# Patient Record
Sex: Male | Born: 1955 | Race: White | Hispanic: No | Marital: Married | Smoking: Former smoker
Health system: Southern US, Community
[De-identification: ages and names within clinical notes are randomized; demographics above are authoritative.]

## PROBLEM LIST (undated history)

## (undated) DIAGNOSIS — C3490 Malignant neoplasm of unspecified part of unspecified bronchus or lung: Secondary | ICD-10-CM

## (undated) DIAGNOSIS — I10 Essential (primary) hypertension: Secondary | ICD-10-CM

## (undated) DIAGNOSIS — E785 Hyperlipidemia, unspecified: Secondary | ICD-10-CM

## (undated) DIAGNOSIS — T63301A Toxic effect of unspecified spider venom, accidental (unintentional), initial encounter: Secondary | ICD-10-CM

## (undated) DIAGNOSIS — J189 Pneumonia, unspecified organism: Secondary | ICD-10-CM

## (undated) DIAGNOSIS — Z923 Personal history of irradiation: Secondary | ICD-10-CM

## (undated) DIAGNOSIS — C140 Malignant neoplasm of pharynx, unspecified: Secondary | ICD-10-CM

## (undated) HISTORY — DX: Malignant neoplasm of pharynx, unspecified: C14.0

## (undated) HISTORY — PX: COLON SURGERY: SHX602

## (undated) HISTORY — PX: APPENDECTOMY: SHX54

## (undated) HISTORY — PX: TOTAL HIP ARTHROPLASTY: SHX124

## (undated) HISTORY — DX: Personal history of irradiation: Z92.3

## (undated) HISTORY — DX: Hyperlipidemia, unspecified: E78.5

## (undated) HISTORY — DX: Toxic effect of unspecified spider venom, accidental (unintentional), initial encounter: T63.301A

## (undated) HISTORY — DX: Essential (primary) hypertension: I10

## (undated) HISTORY — PX: JOINT REPLACEMENT: SHX530

---
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
## 1991-02-20 DIAGNOSIS — T63301A Toxic effect of unspecified spider venom, accidental (unintentional), initial encounter: Secondary | ICD-10-CM

## 1991-02-20 HISTORY — DX: Toxic effect of unspecified spider venom, accidental (unintentional), initial encounter: T63.301A

## 1997-09-13 ENCOUNTER — Other Ambulatory Visit: Admission: RE | Admit: 1997-09-13 | Discharge: 1997-09-13 | Payer: Self-pay | Admitting: *Deleted

## 1997-09-30 ENCOUNTER — Inpatient Hospital Stay (HOSPITAL_COMMUNITY): Admission: RE | Admit: 1997-09-30 | Discharge: 1997-10-03 | Payer: Self-pay | Admitting: *Deleted

## 1997-11-24 ENCOUNTER — Inpatient Hospital Stay (HOSPITAL_COMMUNITY): Admission: RE | Admit: 1997-11-24 | Discharge: 1997-11-27 | Payer: Self-pay | Admitting: *Deleted

## 1997-11-24 ENCOUNTER — Encounter: Payer: Self-pay | Admitting: *Deleted

## 1997-12-24 ENCOUNTER — Encounter: Payer: Self-pay | Admitting: Emergency Medicine

## 1997-12-24 ENCOUNTER — Inpatient Hospital Stay (HOSPITAL_COMMUNITY): Admission: EM | Admit: 1997-12-24 | Discharge: 1997-12-24 | Payer: Self-pay | Admitting: Emergency Medicine

## 1999-12-08 ENCOUNTER — Encounter: Payer: Self-pay | Admitting: *Deleted

## 1999-12-13 ENCOUNTER — Inpatient Hospital Stay (HOSPITAL_COMMUNITY): Admission: RE | Admit: 1999-12-13 | Discharge: 1999-12-15 | Payer: Self-pay | Admitting: *Deleted

## 2006-02-19 HISTORY — PX: THROAT SURGERY: SHX803

## 2006-07-26 ENCOUNTER — Encounter (INDEPENDENT_AMBULATORY_CARE_PROVIDER_SITE_OTHER): Payer: Self-pay | Admitting: Otolaryngology

## 2006-07-26 ENCOUNTER — Ambulatory Visit (HOSPITAL_BASED_OUTPATIENT_CLINIC_OR_DEPARTMENT_OTHER): Admission: RE | Admit: 2006-07-26 | Discharge: 2006-07-26 | Payer: Self-pay | Admitting: Otolaryngology

## 2006-08-05 ENCOUNTER — Ambulatory Visit: Admission: RE | Admit: 2006-08-05 | Discharge: 2006-10-10 | Payer: Self-pay | Admitting: Radiation Oncology

## 2006-08-09 ENCOUNTER — Ambulatory Visit (HOSPITAL_COMMUNITY): Admission: RE | Admit: 2006-08-09 | Discharge: 2006-08-09 | Payer: Self-pay | Admitting: Radiation Oncology

## 2006-11-04 ENCOUNTER — Ambulatory Visit (HOSPITAL_COMMUNITY): Admission: RE | Admit: 2006-11-04 | Discharge: 2006-11-04 | Payer: Self-pay | Admitting: Radiation Oncology

## 2006-11-04 ENCOUNTER — Encounter: Payer: Self-pay | Admitting: Radiation Oncology

## 2007-07-31 ENCOUNTER — Ambulatory Visit (HOSPITAL_COMMUNITY): Admission: RE | Admit: 2007-07-31 | Discharge: 2007-07-31 | Payer: Self-pay | Admitting: Radiation Oncology

## 2008-02-09 ENCOUNTER — Ambulatory Visit (HOSPITAL_COMMUNITY): Admission: RE | Admit: 2008-02-09 | Discharge: 2008-02-09 | Payer: Self-pay | Admitting: Radiation Oncology

## 2008-08-19 ENCOUNTER — Ambulatory Visit (HOSPITAL_COMMUNITY): Admission: RE | Admit: 2008-08-19 | Discharge: 2008-08-19 | Payer: Self-pay | Admitting: Radiation Oncology

## 2010-07-04 NOTE — Op Note (Signed)
NAMEJUMAR, GREENSTREET NO.:  [id number]   MEDICAL RECORD NO.:  [id number]          PATIENT TYPE:  AMB   LOCATION:  DSC                          FACILITY:  [facility]   PHYSICIAN:  Kristine Garbe. Ezzard Standing, M.D.DATE OF BIRTH:  [birth year]   DATE OF PROCEDURE:  07/26/2006  DATE OF DISCHARGE:                               OPERATIVE REPORT   PREOP DIAGNOSIS:  Hoarseness with left anterior vocal cord lesion.   POSTOP DIAGNOSIS:  Hoarseness with left anterior vocal cord lesion.   OPERATION:  Microlaryngoscopy with biopsy of the left anterior vocal  cord lesion.   SURGEON:  Kristine Garbe. Ezzard Standing, M.D.   ANESTHESIA:  General endotracheal.   COMPLICATIONS:  None.   BRIEF CLINICAL NOTE:  Robert Powers is a 55 year old gentleman who has had  hoarseness now for a couple of months.  He has a significant history of  smoking, alcohol use.  On exam in the office he has a lesion involving  the anterior portion of the left true vocal cord.  He is taken to the  operating room, at this time, for direct laryngoscopy and biopsy of the  left vocal cord lesion.   DESCRIPTION OF PROCEDURE:  After adequate endotracheal anesthesia, a  direct laryngoscopy was performed.  The base of the tongue and  epiglottis, were all normal.  Both piriform sinuses were clear.  Epiglottis was normal.  On evaluation of the vocal cords:  The right  vocal cord was clear and had some slight areas of leukoplakia, and a  little bit of the thickening maybe reactive from the lesion on the left  vocal cord.  Left vocal cord had more of a bulky lesion on the anterior  portion of the vocal cord extending all the way up to the anterior  commissure.  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  Several  biopsies were obtained from the left anterior vocal cord lesion.   Cotton pledgets soaked in adrenaline were placed hemostasis.  Photos  were  obtained.  Biopsy was sent to pathology.  Following biopsies, the  procedure was completed.  Robert Powers was awoken from anesthesia and  transferred to the recovery room; postop doing well.   DISPOSITION:  Robert Powers is discharged home later this morning on Tylenol and  Vicodin p.r.n. pain.  He was instructed on voice rest for the next week.  I will have him followup in my office in 6 days for recheck, review  pathology, and plan further therapy of left vocal cord lesion.  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           ______________________________  Kristine Garbe. Ezzard Standing, M.D.     CEN/MEDQ  D:  07/26/2006  T:  07/26/2006  Job:  [id number]   cc:   Molly Maduro A. Nicholos Johns, M.D.

## 2010-07-07 NOTE — Op Note (Signed)
[place]  Patient:    Robert Powers, Robert Powers                           MRN: [id number] Proc. Date: 12/13/99 Adm. 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  OPERATIVE PROCEDURE:  Removal of initial acetabular cup liner and replaced with new, in new position.  ANESTHESIA:  General.  SURGEON:  Reynolds Bowl, M.D.  ASSISTANT:  Nadara Mustard, M.D.  DESCRIPTION OF PROCEDURE:  The patient was given a general anesthetic and given 2 g of Ancef IV.  A Foley catheter in place.  He was placed in the lateral position with the right side up.  Prepped and draped in the usual manner, and the area isolated with Iobans.  Under general anesthetic we tried to maneuver his hip around and dislocate it, knowing that he had one frank dislocation and he has been having multiple anterior subluxations.  We were unable to get the sensation of dislocating his hip.  With the hip flexed, I went through the old posterolateral incision, down to the IT band and used a cup to reflect some of the subcutaneous tissues to see the band, and glutei well.  I opened the IT band and then digitally undermined it more proximally, then divided it, and came to the gluteus muscles which were then digitally divided, exposing the posterior aspect of the hip which at this point was solid thick scar tissue.  We dissected with a cup and isolated the abductors, put a retractor in place.  Digitally palpated for the sciatic nerve, but did not dissect it.  Then using a Bovie and sharp dissection, dissected the scar tissue, old capsule, and soft tissue off of the trochanter, neck, and acetabulum.  Tried to subperiosteally reflect this, elevated around so as to expose the  acetabular edges.  A large amount of very thick scar tissue had to be excised for mobility.  Having done this then, I could see the hip joint, and then we could manipulate the hip, and it would dislocate anteriorly.  The mobility allowed the proximal femur to be retracted sufficiently that with little added difficulty we were able to dissect around the acetabular cup. Then the cup was removed by using a drill hole and a 4.5 cancellus screw. After dislocating the cup, we cleaned around the edges of the metal acetabulum and then thoroughly irrigated the area and I installed a new cup with the center of the 10 degree posterior lip over the area which we had marked where the hip was dislocated anteriorly.  Having done that, we seated the new cup, copiously irrigated, removed all bits of debris, and inspected the ceramic head and it appeared entirely normal, undamaged.  Therefore reduced this back into the hip, running through a full range of motion and found that his soft tissue tension was good, and with a lot of effort we could not dislocate his hip anteriorly, superiorly, or posteriorly, except in marked adduction, flexion, and internal rotation where we could not posteriorly.  We saw this visually and palpably and could feel no impinging tissue.  I felt that this  should remain quite stable.  We were able to again copiously irrigate it. Then I approximated the sheet of soft tissue and scar that dissected off posteriorly, back to the base of the trochanter where it had been dissected from, and this was done with several #0 Vicryl sutures.  The IT band was approximated with several #0 Vicryl sutures figure-of-eight.  The lesser tissues were approximated with #2-0 Vicryl.  There was one suction drain placed superficial to the IT band.  The skin edges were approximated with metal staples.  The skin was injected with Marcaine.  A bulky dressing was applied.  The drain was taped in place.  The  patient returned to the recovery room in good condition. DD:  12/13/99 TD:  12/13/99 Job: [id number]

## 2010-11-20 DIAGNOSIS — J189 Pneumonia, unspecified organism: Secondary | ICD-10-CM

## 2010-11-20 HISTORY — DX: Pneumonia, unspecified organism: J18.9

## 2010-11-29 ENCOUNTER — Ambulatory Visit (INDEPENDENT_AMBULATORY_CARE_PROVIDER_SITE_OTHER): Payer: 59 | Admitting: Internal Medicine

## 2010-11-29 ENCOUNTER — Encounter: Payer: Self-pay | Admitting: Internal Medicine

## 2010-11-29 VITALS — BP 130/86 | HR 67 | Temp 98.1°F | Ht 72.0 in | Wt 230.4 lb

## 2010-11-29 DIAGNOSIS — R05 Cough: Secondary | ICD-10-CM

## 2010-11-29 DIAGNOSIS — R059 Cough, unspecified: Secondary | ICD-10-CM

## 2010-11-29 MED ORDER — PREDNISONE (PAK) 10 MG PO TABS: ORAL_TABLET | ORAL | Status: AC

## 2010-11-29 MED ORDER — BUDESONIDE-FORMOTEROL FUMARATE 160-4.5 MCG/ACT IN AERO: INHALATION_SPRAY | RESPIRATORY_TRACT | Status: DC

## 2010-11-29 NOTE — Assessment & Plan Note (Addendum)
Acute onset with uri and assoc mild airflow obstruction on exam typical of acute bronchitis or even bronchopneumonia but no evidence of ongoing infection s/p rx with tamiflu and levaquin.  He may have occult asthma or cough inducing reflux inducing cough but based on the abrupt onset of this illness it is very [place] he has sign copd or Chronic AB  See instructions for specific recommendations which were reviewed directly with the patient who was given a copy with highlighter outlining the key components.   The proper method of use, as well as anticipated side effects, of this metered-dose inhaler are discussed and demonstrated to the patient. Improved to 75% with coaching and no cough elicited with symbicort 160 so should tolerate this well ? Will he need it longterm remains to be seen  Needs f/u ov with cxr and may do pft's if not 100% back to baseline without the need for chronic rx

## 2010-11-29 NOTE — Patient Instructions (Signed)
Symbicort 160 Take 2 puffs first thing in am and then another 2 puffs about 12 hours later for breathing problems  Take delsym two tsp every 12 hours  As needed for cough and stop mucinex   Prednisone 10 mg take  4 each am x 2 days,   2 each am x 2 days,  1 each am x2days and stop   GERD (REFLUX)  is an extremely common cause of respiratory symptoms, many times with no significant heartburn at all.    It can be treated with medication, but also with lifestyle changes including avoidance of late meals, excessive alcohol, smoking cessation, and avoid fatty foods, chocolate, peppermint, colas, red wine, and acidic juices such as orange juice.  NO MINT OR MENTHOL PRODUCTS SO NO COUGH DROPS  USE SUGARLESS CANDY INSTEAD (jolley ranchers or Stover's)  NO OIL BASED VITAMINS - use powdered substitutes.    Please schedule a follow up office visit in 2 weeks, sooner if needed

## 2010-11-29 NOTE — Progress Notes (Signed)
  Subjective:    Patient ID: Robert Powers, male    DOB: [birth year], 55 y.o.   MRN: [id number]  HPI  70 yowm quit smoking 2008 p dx of throat ca with surgery then RT dismissed by [facility]/ RT in 2011 and no respiratory problems atl all  until Oct 2012 > referred by Vernon Prey to pulmonary clinic for cough and sob   11/29/2010 Initial pulmonary office eval cc acute onset cough 9/28 dry progressed during day, head stopped up, low grade fever 102, achy all over saw primary doctor 10/1  rec cough suppression then saw Christell Constant 10/3 with cxr ? pna rx levaquin and tamiflu with persistent  Fits of cough to point where takes his breath and voice away.  Has not used inhalers. mucinex not effective at controlling coughing fits with minimal mucoid sputum,  Assoc with mild nasal congestion, no sneezing. Cough worse with activity/ exertion but better at rest and sleeping ok without nocturnal  or early am exac of resp c/o's.   Also denies any obvious fluctuation of symptoms with weather or environmental changes or other aggravating or alleviating factors except as outlined above      Review of Systems  Constitutional: Positive for appetite change. Negative for fever, chills, activity change and unexpected weight change.  HENT: Positive for congestion. Negative for sore throat, rhinorrhea, sneezing, trouble swallowing, dental problem, voice change and postnasal drip.   Eyes: Negative for visual disturbance.  Respiratory: Positive for cough and shortness of breath. Negative for choking.   Cardiovascular: Negative for chest pain and leg swelling.  Gastrointestinal: Negative for nausea, vomiting and abdominal pain.  Genitourinary: Negative for difficulty urinating.  Musculoskeletal: Positive for arthralgias.  Skin: Negative for rash.  Psychiatric/Behavioral: Negative for behavioral problems and confusion.       Objective:   Physical Exam amb slt hoarse wm chewing mint gum, nad  Wt  230 11/29/2010  HEENT mild  turbinate edema.  Poor dentition. Oropharynx no thrush or excess pnd or cobblestoning.  No JVD or cervical adenopathy. Mild accessory muscle hypertrophy. Trachea midline, nl thryroid. Chest was hyperinflated by percussion with diminished breath sounds and moderate increased exp time with trace end exp bilateral wheeze. Hoover sign positive at mid inspiration. Regular rate and rhythm without murmur gallop or rub or increase P2 or edema.  Abd: no hsm, nl excursion. Ext warm without cyanosis or clubbing.     cxr  Out side film: 11/22/10 bronchitic changes esp rll, needs nipple markers on repeat    Assessment & Plan:

## 2010-11-30 ENCOUNTER — Encounter: Payer: Self-pay | Admitting: Internal Medicine

## 2010-12-07 LAB — BASIC METABOLIC PANEL
CO2: 27
GFR calc non Af Amer: >60
Glucose, Bld: 115 — ABNORMAL HIGH
Potassium: 4.1
Sodium: 138

## 2010-12-07 LAB — POCT HEMOGLOBIN-HEMACUE: Hemoglobin: 16.8

## 2010-12-20 ENCOUNTER — Ambulatory Visit (INDEPENDENT_AMBULATORY_CARE_PROVIDER_SITE_OTHER)
Admission: RE | Admit: 2010-12-20 | Discharge: 2010-12-20 | Disposition: A | Discharge status: Home / Self Care | Payer: 59 | Source: Ambulatory Visit | Attending: Internal Medicine | Admitting: Internal Medicine

## 2010-12-20 ENCOUNTER — Encounter: Payer: Self-pay | Admitting: Internal Medicine

## 2010-12-20 ENCOUNTER — Ambulatory Visit (INDEPENDENT_AMBULATORY_CARE_PROVIDER_SITE_OTHER): Payer: 59 | Admitting: Internal Medicine

## 2010-12-20 VITALS — BP 140/82 | HR 72 | Ht 72.0 in | Wt 239.8 lb

## 2010-12-20 DIAGNOSIS — R059 Cough, unspecified: Secondary | ICD-10-CM

## 2010-12-20 DIAGNOSIS — R05 Cough: Secondary | ICD-10-CM

## 2010-12-20 MED ORDER — BUDESONIDE-FORMOTEROL FUMARATE 160-4.5 MCG/ACT IN AERO: INHALATION_SPRAY | RESPIRATORY_TRACT | Status: DC

## 2010-12-20 NOTE — Patient Instructions (Addendum)
Please remember to go to the x-ray department downstairs for your tests - we will call you with the results when then are available.   Ok to use symbicort (red inhaler) up to 2 puffs every 12hours if needed for breathing or coughing  Please schedule a follow up visit in 3 months but call sooner if needed with pfts

## 2010-12-20 NOTE — Progress Notes (Signed)
  Subjective:    Patient ID: Robert Powers, male    DOB: 02/11/1956, 55 y.o.   MRN: [id number]  HPI  8 yowm quit smoking 2008 p dx of throat ca with surgery then RT dismissed by [facility]/ RT in 2011 and no respiratory problems atl all  until Oct 2012 > referred by Vernon Prey to pulmonary clinic for cough and sob   11/29/2010 Initial pulmonary office eval cc acute onset cough 9/28 dry progressed during day, head stopped up, low grade fever 102, achy all over saw primary doctor 10/1  rec cough suppression then saw Christell Constant 10/3 with cxr ? pna rx levaquin and tamiflu with persistent  Fits of cough to point where takes his breath and voice away.  Has not used inhalers. mucinex not effective at controlling coughing fits with minimal mucoid sputum,  Assoc with mild nasal congestion, no sneezing. Cough worse with activity/ exertion but better at rest   rec  Symbicort 160 Take 2 puffs first thing in am and then another 2 puffs about 12 hours later for breathing problems Take delsym two tsp every 12 hours  As needed for cough and stop mucinex  Prednisone 10 mg take  4 each am x 2 days,   2 each am x 2 days,  1 each am x2days and stop  GERD CXR with nipple markers    12/20/2010 f/u ov/Robert Powers cc all better and not needing symbicort at all, denies limiting sob or cough  sleeping ok without nocturnal  or early am exac of resp c/o's. Also denies any obvious fluctuation of symptoms with weather or environmental changes or other aggravating or alleviating factors except as outlined above      .       Objective:   Physical Exam amb wm nad   Wt  230 11/29/2010 > 12/20/2010  239   HEENT mild turbinate edema.  Poor dentition. Oropharynx no thrush or excess pnd or cobblestoning.  No JVD or cervical adenopathy. Mild accessory muscle hypertrophy. Trachea midline, nl thryroid. Chest was hyperinflated by percussion with diminished breath sounds and moderate increased exp time with trace end exp bilateral wheeze.  Hoover sign positive at mid inspiration. Regular rate and rhythm without murmur gallop or rub or increase P2 or edema.  Abd: no hsm, nl excursion. Ext warm without cyanosis or clubbing.     cxr  Out side film: 11/22/10 bronchitic changes esp rll, needs nipple markers on repeat cxr 12/20/10 Stable. No acute cardiopulmonary process.      Assessment & Plan:

## 2010-12-21 ENCOUNTER — Telehealth: Payer: Self-pay | Admitting: Internal Medicine

## 2010-12-21 NOTE — Assessment & Plan Note (Addendum)
Dramatic and convincing resolution of symptoms without perceived need for any maint rx - this is probably ok with symbicort because it will start working again within 5 min of perceived need, not true of any other choices for maint rx for copd (advair or spiriva) - reviewed mdi use which he has nearly perfected so should be fine with "prn" symbicort in this case  Needs pft's to complete the w/u  See instructions for specific recommendations which were reviewed directly with the patient who was given a copy with highlighter outlining the key components.

## 2010-12-26 ENCOUNTER — Telehealth: Payer: Self-pay | Admitting: Internal Medicine

## 2010-12-26 NOTE — Progress Notes (Signed)
Quick Note:  Spoke with patient- aware of results and no questions or concerns at this time. ______

## 2010-12-26 NOTE — Telephone Encounter (Signed)
Notes Recorded by Sandrea Hughs, MD on 12/21/2010 at 7:24 AM Call pt: Reviewed cxr and no acute change so no change in recommendations made at [facility] with patient- aware of results and no questions or concerns at this time.

## 2011-04-20 ENCOUNTER — Other Ambulatory Visit: Payer: Self-pay | Admitting: Gastroenterology

## 2011-05-08 ENCOUNTER — Encounter (INDEPENDENT_AMBULATORY_CARE_PROVIDER_SITE_OTHER): Payer: Self-pay | Admitting: General Surgery

## 2011-05-08 ENCOUNTER — Ambulatory Visit (INDEPENDENT_AMBULATORY_CARE_PROVIDER_SITE_OTHER): Payer: 59 | Admitting: General Surgery

## 2011-05-08 VITALS — BP 154/82 | HR 80 | Temp 97.6°F | Resp 18 | Ht 73.0 in | Wt 231.0 lb

## 2011-05-08 DIAGNOSIS — D126 Benign neoplasm of colon, unspecified: Secondary | ICD-10-CM

## 2011-05-08 DIAGNOSIS — D374 Neoplasm of uncertain behavior of colon: Secondary | ICD-10-CM
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
## 2011-06-05 ENCOUNTER — Encounter (HOSPITAL_COMMUNITY)
Admission: RE | Admit: 2011-06-05 | Discharge: 2011-06-05 | Disposition: A | Discharge status: Home / Self Care | Payer: 59 | Source: Ambulatory Visit | Attending: General Surgery | Admitting: General Surgery

## 2011-06-05 ENCOUNTER — Encounter (HOSPITAL_COMMUNITY): Admission: RE | Admit: 2011-06-05 | Payer: 59 | Source: Ambulatory Visit

## 2011-06-05 ENCOUNTER — Encounter (HOSPITAL_COMMUNITY): Payer: Self-pay

## 2011-06-05 ENCOUNTER — Other Ambulatory Visit (HOSPITAL_COMMUNITY): Payer: 59

## 2011-06-05 HISTORY — DX: Pneumonia, unspecified organism: J18.9

## 2011-06-05 LAB — COMPREHENSIVE METABOLIC PANEL
AST: 52 U/L — ABNORMAL HIGH (ref 0–37)
CO2: 29 mEq/L (ref 19–32)
Calcium: 9.6 mg/dL (ref 8.4–10.5)
Creatinine, Ser: 0.98 mg/dL (ref 0.50–1.35)
GFR calc Af Amer: >90 mL/min (ref >90)
GFR calc non Af Amer: >90 mL/min (ref >90)

## 2011-06-05 LAB — SURGICAL PCR SCREEN
MRSA, PCR: NEGATIVE
Staphylococcus aureus: NEGATIVE

## 2011-06-05 LAB — CBC
Hemoglobin: 14.6 g/dL (ref 13.0–17.0)
MCH: 35 pg — ABNORMAL HIGH (ref 26.0–34.0)
MCHC: 34.8 g/dL (ref 30.0–36.0)
Platelets: 231 10*3/uL (ref 150–400)

## 2011-06-05 LAB — ABO/RH: ABO/RH(D): A POS

## 2011-06-05 LAB — PREPARE RBC (CROSSMATCH)

## 2011-06-05 NOTE — Progress Notes (Signed)
PCP is Dr. Nicholos Johns at [facility].

## 2011-06-05 NOTE — Pre-Procedure Instructions (Signed)
[address] MELDON HANZLIK  06/05/2011   Your procedure is scheduled on:  Tuesday April 23   Report to [facility] at 5:30 AM.  Call this number if you have problems the morning of surgery: [phone]   Remember:   Do not eat food:After Midnight.  May have clear liquids: up to 4 Hours before arrival.  Clear liquids include soda, tea, black coffee, apple or grape juice, broth.  Take these medicines the morning of surgery with A SIP OF WATER: Allopurinol, Cardizem, Bystolic   Do not wear jewelry, make-up or nail polish.  Do not wear lotions, powders, or perfumes. You may wear deodorant.  Do not shave 48 hours prior to surgery.  Do not bring valuables to the hospital.  Contacts, dentures or bridgework may not be worn into surgery.  Leave suitcase in the car. After surgery it may be brought to your room.  For patients admitted to the hospital, checkout time is 11:00 AM the day of discharge.   Patients discharged the day of surgery will not be allowed to drive home.  Name and phone number of your driver: NA  Special Instructions: CHG Shower Use Special Wash: 1/2 bottle night before surgery and 1/2 bottle morning of surgery.   Please read over the following fact sheets that you were given: Pain Booklet, Coughing and Deep Breathing, Blood Transfusion Information and Surgical Site Infection Prevention

## 2011-06-06 LAB — CEA: CEA: 1.2 ng/mL (ref 0.0–5.0)

## 2011-06-08 ENCOUNTER — Encounter (HOSPITAL_COMMUNITY): Payer: Self-pay | Admitting: Pharmacy Technician

## 2011-06-11 MED ORDER — SODIUM CHLORIDE 0.9 % IV SOLN
1.0000 g | INTRAVENOUS | Status: AC
  Administered 2011-06-12: 1 g via INTRAVENOUS
  Filled 2011-06-11: qty 1

## 2011-06-12 ENCOUNTER — Encounter (HOSPITAL_COMMUNITY): Payer: Self-pay | Admitting: Anesthesiology

## 2011-06-12 ENCOUNTER — Encounter (HOSPITAL_COMMUNITY)
Admission: RE | Disposition: A | Discharge status: Home / Self Care | Payer: Self-pay | Source: Ambulatory Visit | Attending: General Surgery

## 2011-06-12 ENCOUNTER — Inpatient Hospital Stay (HOSPITAL_COMMUNITY)
Admission: RE | Admit: 2011-06-12 | Discharge: 2011-06-15 | DRG: 331 | Disposition: A | Discharge status: Home / Self Care | Payer: 59 | Source: Ambulatory Visit | Attending: General Surgery | Admitting: General Surgery

## 2011-06-12 ENCOUNTER — Ambulatory Visit (HOSPITAL_COMMUNITY): Payer: 59 | Admitting: Anesthesiology

## 2011-06-12 ENCOUNTER — Encounter (HOSPITAL_COMMUNITY): Payer: Self-pay | Admitting: *Deleted

## 2011-06-12 DIAGNOSIS — Z01818 Encounter for other preprocedural examination: Secondary | ICD-10-CM

## 2011-06-12 DIAGNOSIS — D375 Neoplasm of uncertain behavior of rectum: Secondary | ICD-10-CM

## 2011-06-12 DIAGNOSIS — E785 Hyperlipidemia, unspecified: Secondary | ICD-10-CM | POA: Diagnosis present

## 2011-06-12 DIAGNOSIS — D126 Benign neoplasm of colon, unspecified: Principal | ICD-10-CM | POA: Diagnosis present

## 2011-06-12 DIAGNOSIS — Z8589 Personal history of malignant neoplasm of other organs and systems: Secondary | ICD-10-CM

## 2011-06-12 DIAGNOSIS — Z96649 Presence of unspecified artificial hip joint: Secondary | ICD-10-CM

## 2011-06-12 DIAGNOSIS — M129 Arthropathy, unspecified: Secondary | ICD-10-CM | POA: Diagnosis present

## 2011-06-12 DIAGNOSIS — M109 Gout, unspecified: Secondary | ICD-10-CM | POA: Diagnosis present

## 2011-06-12 DIAGNOSIS — D371 Neoplasm of uncertain behavior of stomach: Secondary | ICD-10-CM

## 2011-06-12 DIAGNOSIS — I10 Essential (primary) hypertension: Secondary | ICD-10-CM | POA: Diagnosis present

## 2011-06-12 DIAGNOSIS — D378 Neoplasm of uncertain behavior of other specified digestive organs: Secondary | ICD-10-CM

## 2011-06-12 SURGERY — COLECTOMY, RIGHT, LAPAROSCOPIC
Anesthesia: General | Site: Abdomen | Laterality: Right | Wound class: Clean Contaminated

## 2011-06-12 MED ORDER — BUPIVACAINE HCL (PF) 0.25 % IJ SOLN
INTRAMUSCULAR | Status: DC | PRN
  Administered 2011-06-12: 8 mL

## 2011-06-12 MED ORDER — HYDROCHLOROTHIAZIDE 25 MG PO TABS
25.0000 mg | ORAL_TABLET | Freq: Every day | ORAL | Status: DC
  Administered 2011-06-13 – 2011-06-15 (×3): 25 mg via ORAL
  Filled 2011-06-12 (×3): qty 1

## 2011-06-12 MED ORDER — MORPHINE SULFATE (PF) 1 MG/ML IV SOLN
INTRAVENOUS | Status: DC
Start: 2011-06-12 — End: 2011-06-14
  Administered 2011-06-12: 11:00:00 via INTRAVENOUS
  Administered 2011-06-12: 16.5 mg via INTRAVENOUS
  Administered 2011-06-12: 13.5 mg via INTRAVENOUS
  Administered 2011-06-12: 17:00:00 via INTRAVENOUS
  Administered 2011-06-13: 9 mg via INTRAVENOUS
  Administered 2011-06-13 (×2): via INTRAVENOUS
  Administered 2011-06-13: 10.5 mg via INTRAVENOUS
  Administered 2011-06-13: 3 mg via INTRAVENOUS
  Administered 2011-06-13: 9 mg via INTRAVENOUS
  Administered 2011-06-13: 7.01 mg via INTRAVENOUS
  Administered 2011-06-13: 4.5 mg via INTRAVENOUS
  Administered 2011-06-14: 6 mg via INTRAVENOUS
  Administered 2011-06-14: 1.5 mg via INTRAVENOUS
  Filled 2011-06-12 (×3): qty 25

## 2011-06-12 MED ORDER — ACETAMINOPHEN 650 MG RE SUPP: 650.0000 mg | Freq: Four times a day (QID) | RECTAL | Status: DC | PRN

## 2011-06-12 MED ORDER — 0.9 % SODIUM CHLORIDE (POUR BTL) OPTIME
TOPICAL | Status: DC | PRN
  Administered 2011-06-12 (×2): 1000 mL

## 2011-06-12 MED ORDER — ROCURONIUM BROMIDE 100 MG/10ML IV SOLN
INTRAVENOUS | Status: DC | PRN
  Administered 2011-06-12: 10 mg via INTRAVENOUS
  Administered 2011-06-12: 50 mg via INTRAVENOUS
  Administered 2011-06-12 (×2): 10 mg via INTRAVENOUS

## 2011-06-12 MED ORDER — DILTIAZEM HCL ER COATED BEADS 180 MG PO CP24
180.0000 mg | ORAL_CAPSULE | Freq: Every day | ORAL | Status: DC
  Filled 2011-06-12: qty 1

## 2011-06-12 MED ORDER — HEPARIN SODIUM (PORCINE) 5000 UNIT/ML IJ SOLN
5000.0000 [IU] | Freq: Once | INTRAMUSCULAR | Status: AC
  Administered 2011-06-12: 5000 [IU] via SUBCUTANEOUS

## 2011-06-12 MED ORDER — NEOSTIGMINE METHYLSULFATE 1 MG/ML IJ SOLN
INTRAMUSCULAR | Status: DC | PRN
  Administered 2011-06-12: 4 mg via INTRAVENOUS

## 2011-06-12 MED ORDER — LACTATED RINGERS IV SOLN
INTRAVENOUS | Status: DC | PRN
  Administered 2011-06-12 (×3): via INTRAVENOUS

## 2011-06-12 MED ORDER — ONDANSETRON HCL 4 MG/2ML IJ SOLN
INTRAMUSCULAR | Status: DC | PRN
  Administered 2011-06-12: 4 mg via INTRAVENOUS

## 2011-06-12 MED ORDER — PANTOPRAZOLE SODIUM 40 MG IV SOLR
40.0000 mg | Freq: Every day | INTRAVENOUS | Status: DC
  Administered 2011-06-12 – 2011-06-14 (×3): 40 mg via INTRAVENOUS
  Filled 2011-06-12 (×4): qty 40

## 2011-06-12 MED ORDER — DIPHENHYDRAMINE HCL 12.5 MG/5ML PO ELIX
12.5000 mg | ORAL_SOLUTION | Freq: Four times a day (QID) | ORAL | Status: DC | PRN
  Filled 2011-06-12: qty 5

## 2011-06-12 MED ORDER — ONDANSETRON HCL 4 MG/2ML IJ SOLN: 4.0000 mg | Freq: Four times a day (QID) | INTRAMUSCULAR | Status: DC | PRN

## 2011-06-12 MED ORDER — ALVIMOPAN 12 MG PO CAPS: 12.0000 mg | ORAL_CAPSULE | Freq: Once | ORAL | Status: DC

## 2011-06-12 MED ORDER — MIDAZOLAM HCL 5 MG/5ML IJ SOLN
INTRAMUSCULAR | Status: DC | PRN
  Administered 2011-06-12: 2 mg via INTRAVENOUS

## 2011-06-12 MED ORDER — ALVIMOPAN 12 MG PO CAPS
12.0000 mg | ORAL_CAPSULE | ORAL | Status: AC
  Administered 2011-06-12: 12 mg via ORAL
  Filled 2011-06-12: qty 1

## 2011-06-12 MED ORDER — HEPARIN SODIUM (PORCINE) 5000 UNIT/ML IJ SOLN
INTRAMUSCULAR | Status: AC
  Filled 2011-06-12: qty 1

## 2011-06-12 MED ORDER — HYDROMORPHONE HCL PF 1 MG/ML IJ SOLN
0.2500 mg | INTRAMUSCULAR | Status: DC | PRN
  Administered 2011-06-12 (×2): 0.5 mg via INTRAVENOUS

## 2011-06-12 MED ORDER — NALOXONE HCL 0.4 MG/ML IJ SOLN
0.4000 mg | INTRAMUSCULAR | Status: DC | PRN
  Filled 2011-06-12: qty 1

## 2011-06-12 MED ORDER — FENTANYL CITRATE 0.05 MG/ML IJ SOLN
INTRAMUSCULAR | Status: DC | PRN
  Administered 2011-06-12 (×3): 50 ug via INTRAVENOUS
  Administered 2011-06-12: 250 ug via INTRAVENOUS
  Administered 2011-06-12: 50 ug via INTRAVENOUS

## 2011-06-12 MED ORDER — HEPARIN SODIUM (PORCINE) 5000 UNIT/ML IJ SOLN
5000.0000 [IU] | Freq: Three times a day (TID) | INTRAMUSCULAR | Status: DC
  Administered 2011-06-12 – 2011-06-14 (×5): 5000 [IU] via SUBCUTANEOUS
  Filled 2011-06-12 (×8): qty 1

## 2011-06-12 MED ORDER — SODIUM CHLORIDE 0.9 % IJ SOLN: 9.0000 mL | INTRAMUSCULAR | Status: DC | PRN

## 2011-06-12 MED ORDER — NEBIVOLOL HCL 10 MG PO TABS
10.0000 mg | ORAL_TABLET | Freq: Every day | ORAL | Status: DC
  Administered 2011-06-13 – 2011-06-15 (×3): 10 mg via ORAL
  Filled 2011-06-12 (×3): qty 1

## 2011-06-12 MED ORDER — DILTIAZEM HCL ER COATED BEADS 180 MG PO CP24
180.0000 mg | ORAL_CAPSULE | Freq: Every day | ORAL | Status: DC
  Administered 2011-06-13 – 2011-06-15 (×3): 180 mg via ORAL
  Filled 2011-06-12 (×3): qty 1

## 2011-06-12 MED ORDER — PROPOFOL 10 MG/ML IV EMUL
INTRAVENOUS | Status: DC | PRN
  Administered 2011-06-12: 200 mg via INTRAVENOUS

## 2011-06-12 MED ORDER — ONDANSETRON HCL 4 MG/2ML IJ SOLN
4.0000 mg | Freq: Four times a day (QID) | INTRAMUSCULAR | Status: DC | PRN
  Filled 2011-06-12: qty 2

## 2011-06-12 MED ORDER — DIPHENHYDRAMINE HCL 50 MG/ML IJ SOLN
12.5000 mg | Freq: Four times a day (QID) | INTRAMUSCULAR | Status: DC | PRN
  Filled 2011-06-12: qty 0.25

## 2011-06-12 MED ORDER — SODIUM CHLORIDE 0.9 % IV SOLN
INTRAVENOUS | Status: DC
  Administered 2011-06-12 (×2): via INTRAVENOUS

## 2011-06-12 MED ORDER — ACETAMINOPHEN 325 MG PO TABS: 650.0000 mg | ORAL_TABLET | Freq: Four times a day (QID) | ORAL | Status: DC | PRN

## 2011-06-12 MED ORDER — GLYCOPYRROLATE 0.2 MG/ML IJ SOLN
INTRAMUSCULAR | Status: DC | PRN
  Administered 2011-06-12: .8 mg via INTRAVENOUS

## 2011-06-12 SURGICAL SUPPLY — 68 items
APPLIER CLIP 5 13 M/L LIGAMAX5 (MISCELLANEOUS)
BLADE SURG 10 STRL SS (BLADE) ×2 IMPLANT
BLADE SURG ROTATE 9660 (MISCELLANEOUS) ×2 IMPLANT
CANISTER SUCTION 2500CC (MISCELLANEOUS) ×2 IMPLANT
CELLS DAT CNTRL 66122 CELL SVR (MISCELLANEOUS) ×1 IMPLANT
CHLORAPREP W/TINT 26ML (MISCELLANEOUS) ×2 IMPLANT
CLIP APPLIE 5 13 M/L LIGAMAX5 (MISCELLANEOUS) IMPLANT
CLOTH BEACON ORANGE TIMEOUT ST (SAFETY) ×2 IMPLANT
COVER SURGICAL LIGHT HANDLE (MISCELLANEOUS) ×2 IMPLANT
DERMABOND ADVANCED (GAUZE/BANDAGES/DRESSINGS)
DERMABOND ADVANCED .7 DNX12 (GAUZE/BANDAGES/DRESSINGS) IMPLANT
DRAPE WARM FLUID 44X44 (DRAPE) ×2 IMPLANT
ELECT BLADE 6.5 EXT (BLADE) IMPLANT
ELECT CAUTERY BLADE 6.4 (BLADE) ×4 IMPLANT
ELECT REM PT RETURN 9FT ADLT (ELECTROSURGICAL) ×2
ELECTRODE REM PT RTRN 9FT ADLT (ELECTROSURGICAL) ×1 IMPLANT
GEL ULTRASOUND 20GR AQUASONIC (MISCELLANEOUS) IMPLANT
GLOVE BIO SURGEON STRL SZ7 (GLOVE) ×8 IMPLANT
GLOVE BIO SURGEON STRL SZ7.5 (GLOVE) ×6 IMPLANT
GLOVE BIOGEL PI IND STRL 7.0 (GLOVE) ×3 IMPLANT
GLOVE BIOGEL PI IND STRL 7.5 (GLOVE) ×4 IMPLANT
GLOVE BIOGEL PI INDICATOR 7.0 (GLOVE) ×3
GLOVE BIOGEL PI INDICATOR 7.5 (GLOVE) ×4
GLOVE SURG SIGNA 7.5 PF LTX (GLOVE) ×4 IMPLANT
GLOVE SURG SS PI 6.5 STRL IVOR (GLOVE) ×8 IMPLANT
GLOVE SURG SS PI 7.0 STRL IVOR (GLOVE) ×4 IMPLANT
GOWN STRL NON-REIN LRG LVL3 (GOWN DISPOSABLE) ×10 IMPLANT
GOWN STRL REIN XL XLG (GOWN DISPOSABLE) ×2 IMPLANT
KIT BASIN OR (CUSTOM PROCEDURE TRAY) ×2 IMPLANT
KIT ROOM TURNOVER OR (KITS) ×2 IMPLANT
LIGASURE IMPACT 36 18CM CVD LR (INSTRUMENTS) IMPLANT
NS IRRIG 1000ML POUR BTL (IV SOLUTION) ×4 IMPLANT
PAD ARMBOARD 7.5X6 YLW CONV (MISCELLANEOUS) ×4 IMPLANT
PENCIL BUTTON HOLSTER BLD 10FT (ELECTRODE) ×2 IMPLANT
RTRCTR WOUND ALEXIS 18CM MED (MISCELLANEOUS) ×2
SCALPEL HARMONIC ACE (MISCELLANEOUS) ×2 IMPLANT
SCISSORS LAP 5X35 DISP (ENDOMECHANICALS) ×2 IMPLANT
SET IRRIG TUBING LAPAROSCOPIC (IRRIGATION / IRRIGATOR) IMPLANT
SLEEVE ENDOPATH XCEL 5M (ENDOMECHANICALS) ×6 IMPLANT
SPECIMEN JAR LARGE (MISCELLANEOUS) IMPLANT
SPONGE GAUZE 4X4 12PLY (GAUZE/BANDAGES/DRESSINGS) IMPLANT
SPONGE LAP 18X18 X RAY DECT (DISPOSABLE) ×2 IMPLANT
STAPLER GUN LINEAR PROX 60 (STAPLE) ×2 IMPLANT
STAPLER PROXIMATE 75MM BLUE (STAPLE) ×2 IMPLANT
STAPLER VISISTAT 35W (STAPLE) ×2 IMPLANT
SUCTION POOLE TIP (SUCTIONS) ×2 IMPLANT
SUT PDS AB 1 TP1 54 (SUTURE) ×4 IMPLANT
SUT PDS AB 1 TP1 96 (SUTURE) ×4 IMPLANT
SUT SILK 2 0 (SUTURE) ×2
SUT SILK 2 0 SH CR/8 (SUTURE) ×2 IMPLANT
SUT SILK 2-0 18XBRD TIE 12 (SUTURE) ×2 IMPLANT
SUT SILK 3 0 (SUTURE) ×1
SUT SILK 3 0 SH CR/8 (SUTURE) ×4 IMPLANT
SUT SILK 3-0 18XBRD TIE 12 (SUTURE) ×1 IMPLANT
SUT VIC AB 3-0 SH 8-18 (SUTURE) IMPLANT
SYS LAPSCP GELPORT 120MM (MISCELLANEOUS)
SYSTEM LAPSCP GELPORT 120MM (MISCELLANEOUS) IMPLANT
TOWEL OR 17X24 6PK STRL BLUE (TOWEL DISPOSABLE) ×2 IMPLANT
TOWEL OR 17X26 10 PK STRL BLUE (TOWEL DISPOSABLE) ×2 IMPLANT
TRAY FOLEY CATH 14FRSI W/METER (CATHETERS) ×2 IMPLANT
TRAY LAPAROSCOPIC (CUSTOM PROCEDURE TRAY) ×2 IMPLANT
TROCAR XCEL BLUNT TIP 100MML (ENDOMECHANICALS) IMPLANT
TROCAR XCEL NON-BLD 11X100MML (ENDOMECHANICALS) IMPLANT
TROCAR XCEL NON-BLD 5MMX100MML (ENDOMECHANICALS) ×2 IMPLANT
TUBE CONNECTING 12X1/4 (SUCTIONS) ×2 IMPLANT
TUBING FILTER THERMOFLATOR (ELECTROSURGICAL) ×2 IMPLANT
WATER STERILE IRR 1000ML POUR (IV SOLUTION) IMPLANT
YANKAUER SUCT BULB TIP NO VENT (SUCTIONS) ×4 IMPLANT

## 2011-06-12 NOTE — Transfer of Care (Signed)
Immediate Anesthesia Transfer of Care Note  Patient: Robert Powers  Procedure(s) Performed: Procedure(s) (LRB): LAPAROSCOPIC RIGHT COLECTOMY (Right)  Patient Location: PACU  Anesthesia Type: General  Level of Consciousness: awake and alert   Airway & Oxygen Therapy: Patient Spontanous Breathing and Patient connected to nasal cannula oxygen  Post-op Assessment: Report given to PACU RN and Post -op Vital signs reviewed and stable  Post vital signs: Reviewed and stable  Complications: No apparent anesthesia complications

## 2011-06-12 NOTE — Anesthesia Preprocedure Evaluation (Signed)
Anesthesia Evaluation  Patient identified by MRN, date of birth, ID band Patient awake    Reviewed: Allergy & Precautions, H&P , NPO status , Patient's Chart, lab work & pertinent test results  Airway Mallampati: II  Neck ROM: full    Dental   Pulmonary former smoker         Cardiovascular hypertension,     Neuro/Psych    GI/Hepatic   Endo/Other    Renal/GU      Musculoskeletal  (+) Arthritis -,   Abdominal   Peds  Hematology   Anesthesia Other Findings   Reproductive/Obstetrics                           Anesthesia Physical Anesthesia Plan  ASA: II  Anesthesia Plan: General   Post-op Pain Management:    Induction: Intravenous  Airway Management Planned: Oral ETT  Additional Equipment:   Intra-op Plan:   Post-operative Plan: Extubation in OR  Informed Consent: I have reviewed the patients History and Physical, chart, labs and discussed the procedure including the risks, benefits and alternatives for the proposed anesthesia with the patient or authorized representative who has indicated his/her understanding and acceptance.     Plan Discussed with: CRNA and Surgeon  Anesthesia Plan Comments:         Anesthesia Quick Evaluation

## 2011-06-12 NOTE — Progress Notes (Signed)
Verified Heparin dose for 2200 this evening is ok to give with Dr. Janee Morn.

## 2011-06-12 NOTE — Anesthesia Postprocedure Evaluation (Signed)
Anesthesia Post Note  Patient: Robert Powers  Procedure(s) Performed: Procedure(s) (LRB): LAPAROSCOPIC RIGHT COLECTOMY (Right)  Anesthesia type: general  Patient location: PACU  Post pain: Pain level controlled  Post assessment: Patient's Cardiovascular Status Stable  Last Vitals:  Filed Vitals:   06/12/11 1040  BP:   Pulse:   Temp:   Resp: 15    Post vital signs: Reviewed and stable  Level of consciousness: sedated  Complications: No apparent anesthesia complications

## 2011-06-12 NOTE — Discharge Instructions (Signed)
[facility]      [facility], [place] [phone]  COLON SURGERY: POST OP INSTRUCTIONS  Always review your discharge instruction sheet given to you by the facility where your surgery was performed.  IF YOU HAVE DISABILITY OR FAMILY LEAVE FORMS, YOU MUST BRING THEM TO THE OFFICE FOR PROCESSING.  PLEASE DO NOT GIVE THEM TO YOUR DOCTOR.  1. A prescription for pain medication may be given to you upon discharge.  Take your pain medication as prescribed, if needed.  If narcotic pain medicine is not needed, then you may take acetaminophen (Tylenol) or ibuprofen (Advil) as needed. 2. Take your usually prescribed medications unless otherwise directed. 3. If you need a refill on your pain medication, please contact your pharmacy. They will contact our office to request authorization.  Prescriptions will not be filled after 5pm or on week-ends. 4. You should follow a light diet the first few days after arrival home, such as soup and crackers, pudding, etc.unless your doctor has advised otherwise. A high-fiber, low fat diet can be resumed as tolerated.   Be sure to include lots of fluids daily. Most patients will experience some swelling and bruising on the chest and neck area.  Ice packs will help.  Swelling and bruising can take several days to resolve 5. Most patients will experience some swelling and bruising in the area of the incision. Ice pack will help. Swelling and bruising can take several days to resolve..  6. It is common to experience some constipation if taking pain medication after surgery.  Increasing fluid intake and taking a stool softener will usually help or prevent this problem from occurring.  A mild laxative (Milk of Magnesia or Miralax) should be taken according to package directions if there are no bowel movements after 48 hours. 7.  You may have steri-strips (small skin tapes) in place directly over the incision.  These strips should be left on the skin for 7-10 days.  If your surgeon  used skin glue on the incision, you may shower in 24 hours.  The glue will flake off over the next 2-3 weeks.  Any sutures or staples will be removed at the office during your follow-up visit. You may find that a light gauze bandage over your incision may keep your staples from being rubbed or pulled. You may shower and replace the bandage daily. 8. ACTIVITIES:  You may resume regular (light) daily activities beginning the next day--such as daily self-care, walking, climbing stairs--gradually increasing activities as tolerated.  You may have sexual intercourse when it is comfortable.  Refrain from any heavy lifting or straining until approved by your doctor. a. You may drive when you no longer are taking prescription pain medication, you can comfortably wear a seatbelt, and you can safely maneuver your car and apply brakes b. Return to Work: ___________________________________ 9. You should see your doctor in the office for a follow-up appointment approximately two weeks after your surgery.  Make sure that you call for this appointment within a day or two after you arrive home to insure a convenient appointment time. OTHER INSTRUCTIONS:  _____________________________________________________________ _____________________________________________________________  WHEN TO CALL YOUR DOCTOR: 1. Fever over 101.0 2. Inability to urinate 3. Nausea and/or vomiting 4. Extreme swelling or bruising 5. Continued bleeding from incision. 6. Increased pain, redness, or drainage from the incision. 7. Difficulty swallowing or breathing 8. Muscle cramping or spasms. 9. Numbness or tingling in hands or feet or around lips.  The clinic staff is available to answer  your questions during regular business hours.  Please don't hesitate to call and ask to speak to one of the nurses if you have concerns.  For further questions, please visit [email or web address]

## 2011-06-12 NOTE — Addendum Note (Signed)
Addendum  created 06/12/11 1101 by Raiford Simmonds, MD   Modules edited:Anesthesia Attestations, Anesthesia Responsible Staff
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
## 2011-06-12 NOTE — Op Note (Signed)
Preoperative diagnosis: Right colon polyp unresectable by endoscopy Postoperative diagnosis: Same as above  Procedure: Laparoscopic right colectomy Surgeon: Dr. Harden Mo Asst.: Dr. Ovidio Kin Anesthesia: Gen. Endotracheal Estimated blood loss: 25 cc Drains: None Specimens: Right colon to pathology who confirmed removal of the polyp Complications: None Sponge needle count correct x2 at operation Disposition to recovery in stable condition  Indications:this is a 61 male underwent a screening colonoscopy he had multiple polyps that were benign and excised. He had a larger polyp that was benign but was unresectable by endoscopy. This underwent tattoo placement. This appeared to be at his hepatic flexure. I met him in the office and discussed a laparoscopic right colectomy. We discussed the risks and benefits of the surgery.  Procedure: After informed consent was obtained the patient first underwent a bowel preparation at home. He was then brought to the hospital. He was administered 5000 units of subcutaneous heparin, 1 g of intravenous Invanz, and entereg.he has sequential compression devices placed on his lower extremities prior to beginning. He was then placed under general endotracheal anesthesia. An orogastric tube and Foley were placed. His abdomen was then prepped and draped in the standard sterile surgical fashion. A surgical timeout was then performed.  I infiltrated percent Marcaine in his left upper quadrant. I made an incision. I then inserted a 5 mm Optiview trocar without difficulty. His abdomen was then insufflated to 15 mmHg pressure. I then inserted 3 more 5 mm trocars in the right lower quadrant, left lower quadrant, and suprapubic region. I first approached the cecum where he had his prior appendectomy. There was some scar tissue from the cecum towards his pelvic brim. I released this with a combination of sharp dissection as well as the harmonic scalpel. Once I had freed up  his ileum in his colon in this region I then took down the white line of Toldt with the harmonic scalpel. I took this down and rotated this medially identifying  the duodenum as I did this as well. I then came back and remove the omentum from the transverse colon and freed up the hepatic flexure. The duodenum was identified and was not injured during this portion of the operation. The tattoo was visible at the hepatic flexure. Eventually I had the colon completely medialized laparoscopically. I then made a 7 cm incision in the upper midline. I placed a wound protector. I exteriorized this entire piece of the colon as well as the small intestine. The tattoo was visible and the polyp was palpable. I then divided the terminal ileum with the GIA stapler. I then began to ligate his mesentery with 2-0 silk ligatures. I came across his entire mesentery until I was past my tattoo and the palpable polyp. I then divided the transverse colon with the GIA stapler. The mesentery was then entirely divided and this is passed off the table as a specimen. Pathology confirmed that the polyp was present in my specimen. I then brought the 2 ends together and approximated the small intestine to the colon with 3-0 silk. I then made enterotomies in both. The common enterotomy was then made with a GIA stapler. The open end was then closed with a TA stapler. I oversewed some of the staple line with 3-0 silk sutures. I placed a crotch stitch of 3-0 silk suture as well. I then closed the mesenteric defect with a 2-0 silk. This all appeared to be in good position. I then placed the omentum overlying this. I then removed  the wound protector and closed the incision with #1 PDS. I then reinflated his abdomen. I looked at the small bowel to ensure this is not been kinked it was all in good position. There was no bleeding could be identified. I then placed the omentum back over the anastomosis. I then removed all my trocars and desufflated the  abdomen. I then closed the incisions with staples. Dressings were placed. He tolerated this well was extubated and transferred to recovery in stable condition.

## 2011-06-12 NOTE — Interval H&P Note (Signed)
History and Physical Interval Note:  06/12/2011 7:18 AM  Robert Powers  has presented today for surgery, with the diagnosis of right colon polyp  The various methods of treatment have been discussed with the patient and family. After consideration of risks, benefits and other options for treatment, the patient has consented to  Procedure(s) (LRB): LAPAROSCOPIC RIGHT COLECTOMY (Right) as a surgical intervention .  The patients' history has been reviewed, patient examined, no change in status, stable for surgery.  I have reviewed the patients' chart and labs.  Questions were answered to the patient's satisfaction.     Zahli Vetsch

## 2011-06-12 NOTE — Preoperative (Signed)
Beta Blockers   Reason not to administer Beta Blockers:Not Applicable 

## 2011-06-12 NOTE — Addendum Note (Signed)
Addendum  created 06/12/11 1101 by Roddrick Sharron S Shaneta Cervenka, MD   Modules edited:Anesthesia Attestations, Anesthesia Responsible Staff    

## 2011-06-12 NOTE — Anesthesia Procedure Notes (Signed)
Procedure Name: Intubation Date/Time: 06/12/2011 7:47 AM Performed by: Gwenyth Allegra Pre-anesthesia Checklist: Patient identified, Timeout performed, Emergency Drugs available, Suction available and Patient being monitored Patient Re-evaluated:Patient Re-evaluated prior to inductionOxygen Delivery Method: Circle system utilized Preoxygenation: Pre-oxygenation with 100% oxygen Intubation Type: IV induction Ventilation: Mask ventilation without difficulty and Oral airway inserted - appropriate to patient size Laryngoscope Size: Mac and 3 Grade View: Grade III Tube type: Oral Tube size: 7.5 mm Number of attempts: 2 Airway Equipment and Method: Bougie stylet Secured at: 21 cm Tube secured with: Tape Dental Injury: Teeth and Oropharynx as per pre-operative assessment  Difficulty Due To: Difficulty was anticipated, Difficult Airway- due to anterior larynx, Difficult Airway- due to immobile epiglottis and Difficult Airway- due to dentition

## 2011-06-13 LAB — BASIC METABOLIC PANEL
CO2: 28 mEq/L (ref 19–32)
Calcium: 8.9 mg/dL (ref 8.4–10.5)
Chloride: 101 mEq/L (ref 96–112)
Creatinine, Ser: 0.94 mg/dL (ref 0.50–1.35)
Glucose, Bld: 96 mg/dL (ref 70–99)

## 2011-06-13 LAB — CBC
Hemoglobin: 13.6 g/dL (ref 13.0–17.0)
MCH: 34.9 pg — ABNORMAL HIGH (ref 26.0–34.0)
RBC: 3.9 MIL/uL — ABNORMAL LOW (ref 4.22–5.81)

## 2011-06-13 MED ORDER — POTASSIUM CHLORIDE 10 MEQ/100ML IV SOLN
10.0000 meq | Freq: Once | INTRAVENOUS | Status: AC
  Administered 2011-06-13: 10 meq via INTRAVENOUS
  Filled 2011-06-13: qty 100

## 2011-06-13 MED ORDER — KCL IN DEXTROSE-NACL 20-5-0.45 MEQ/L-%-% IV SOLN
INTRAVENOUS | Status: DC
  Administered 2011-06-13 – 2011-06-14 (×3): via INTRAVENOUS
  Filled 2011-06-13 (×6): qty 1000

## 2011-06-13 MED ORDER — POTASSIUM CHLORIDE 10 MEQ/100ML IV SOLN
10.0000 meq | INTRAVENOUS | Status: AC
  Administered 2011-06-13: 10 meq via INTRAVENOUS
  Filled 2011-06-13: qty 100

## 2011-06-13 MED FILL — Hydromorphone HCl Inj 1 MG/ML: INTRAMUSCULAR | Qty: 1 | Status: AC

## 2011-06-13 NOTE — Progress Notes (Signed)
UR complete 

## 2011-06-13 NOTE — Progress Notes (Signed)
1 Day Post-Op  Subjective: No n/v, no flatus, pain well controlled  Objective: Vital signs in last 24 hours: Temp:  [97.3 F (36.3 C)-98.2 F (36.8 C)] 98 F (36.7 C) (04/24 0518) Pulse Rate:  [62-92] 88  (04/24 0518) Resp:  [11-20] 14  (04/24 0518) BP: (136-165)/(61-92) 146/64 mmHg (04/24 0518) SpO2:  [93 %-100 %] 96 % (04/24 0518) Weight:  [221 lb (100.245 kg)] 221 lb (100.245 kg) (04/23 1540) Last BM Date: 06/11/11  Intake/Output from previous day: 04/23 0701 - 04/24 0700 In: 4533.1 [P.O.:60; I.V.:4473.1] Out: 1775 [Urine:1775] Intake/Output this shift:    General appearance: no distress Resp: clear to auscultation bilaterally Cardio: regular rate and rhythm GI: dressings dry, some bs present, approp tender  Lab Results:   [facility] 06/13/11 0450  WBC 11.1*  HGB 13.6  HCT 39.7  PLT 185   BMET  Basename 06/13/11 0450  NA 139  K 3.3*  CL 101  CO2 28  GLUCOSE 96  BUN 9  CREATININE 0.94  CALCIUM 8.9    Assessment/Plan: POD #1 lap right colectomy 1. Continue pca, entereg 2. pulm toilet 3. antihtn 4. Clear liquids, has good bs today 5. Await path 6. Heparin, scds   LOS: 1 day    [facility] 06/13/2011

## 2011-06-14 LAB — BASIC METABOLIC PANEL
CO2: 30 mEq/L (ref 19–32)
Chloride: 99 mEq/L (ref 96–112)
Glucose, Bld: 102 mg/dL — ABNORMAL HIGH (ref 70–99)
Potassium: 3.5 mEq/L (ref 3.5–5.1)
Sodium: 138 mEq/L (ref 135–145)

## 2011-06-14 LAB — TYPE AND SCREEN
ABO/RH(D): A POS
Antibody Screen: NEGATIVE

## 2011-06-14 MED ORDER — MORPHINE SULFATE 2 MG/ML IJ SOLN: 2.0000 mg | INTRAMUSCULAR | Status: DC | PRN

## 2011-06-14 MED ORDER — ENOXAPARIN SODIUM 40 MG/0.4ML ~~LOC~~ SOLN
40.0000 mg | SUBCUTANEOUS | Status: DC
  Administered 2011-06-14: 40 mg via SUBCUTANEOUS
  Filled 2011-06-14 (×2): qty 0.4

## 2011-06-14 MED ORDER — OXYCODONE-ACETAMINOPHEN 5-325 MG PO TABS
1.0000 | ORAL_TABLET | ORAL | Status: DC | PRN
  Administered 2011-06-14 – 2011-06-15 (×2): 2 via ORAL
  Filled 2011-06-14 (×2): qty 2

## 2011-06-14 NOTE — Progress Notes (Signed)
PCO2 monitor turned off as per patientrequest. . Pt. says it keeps him awake and he wants to sleep. Pt. Is aware of the risks as explained by the RN. Will leave pulse oximeter on and place pt. On O2 at 2l/[place]. Will continue to monitor.

## 2011-06-14 NOTE — Progress Notes (Signed)
2 Days Post-Op  Subjective: No complaints, no n/v or flatus yet  Objective: Vital signs in last 24 hours: Temp:  [98.1 F (36.7 C)-99.4 F (37.4 C)] 98.1 F (36.7 C) (04/25 0603) Pulse Rate:  [75-88] 75  (04/25 0603) Resp:  [14-20] 14  (04/25 0603) BP: (114-137)/(70-80) 114/70 mmHg (04/25 0603) SpO2:  [6 %-96 %] 95 % (04/25 0603) Last BM Date: 06/11/11  Intake/Output from previous day: 04/24 0701 - 04/25 0700 In: 3101.7 [P.O.:480; I.V.:2421.7; IV Piggyback:200] Out: 2250 [Urine:2250] Intake/Output this shift:    General appearance: no distress Resp: clear to auscultation bilaterally Cardio: regular rate and rhythm GI: mild distended, few bs, approp tender, dressing dry  Lab Results:   [facility] 06/13/11 0450  WBC 11.1*  HGB 13.6  HCT 39.7  PLT 185   BMET  Basename 06/13/11 0450  NA 139  K 3.3*  CL 101  CO2 28  GLUCOSE 96  BUN 9  CREATININE 0.94  CALCIUM 8.9    Assessment/Plan: POD 2 lap right colon 1. pca 2. pulm toilet 3. Await ileus resolve, clear liquids, entereg 4. Heparin, scds    LOS: 2 days    [facility] 06/14/2011

## 2011-06-14 NOTE — Progress Notes (Signed)
Pt requested PCO2 monitor be taken off. Explained use and rationale behind device. Warned of risks. Pulse ox remains in place. Will monitor patient closely. [place], [employer]

## 2011-06-15 ENCOUNTER — Telehealth (INDEPENDENT_AMBULATORY_CARE_PROVIDER_SITE_OTHER): Payer: Self-pay | Admitting: General Surgery

## 2011-06-15 LAB — BASIC METABOLIC PANEL
CO2: 30 mEq/L (ref 19–32)
Calcium: 9.5 mg/dL (ref 8.4–10.5)
Creatinine, Ser: 0.86 mg/dL (ref 0.50–1.35)
Glucose, Bld: 100 mg/dL — ABNORMAL HIGH (ref 70–99)

## 2011-06-15 LAB — CBC
HCT: 38.3 % — ABNORMAL LOW (ref 39.0–52.0)
Hemoglobin: 13.3 g/dL (ref 13.0–17.0)
MCH: 35.4 pg — ABNORMAL HIGH (ref 26.0–34.0)
MCV: 101.9 fL — ABNORMAL HIGH (ref 78.0–100.0)
RBC: 3.76 MIL/uL — ABNORMAL LOW (ref 4.22–5.81)

## 2011-06-15 MED ORDER — PANTOPRAZOLE SODIUM 40 MG PO TBEC: 40.0000 mg | DELAYED_RELEASE_TABLET | Freq: Every day | ORAL | Status: DC

## 2011-06-15 MED ORDER — OXYCODONE-ACETAMINOPHEN 10-325 MG PO TABS: 1.0000 | ORAL_TABLET | ORAL | Status: AC | PRN

## 2011-06-15 NOTE — Progress Notes (Signed)
Pt did not get solid breakfast tray this am dispite the order written and the calls placed to get solid food sent up this am. Pt ordered early lunch tray, food did not arrive until after 1200. Pt ate, tolerated solids well. No increased pain. D/c instructions reviewed with pt and his wife. Both verbalized understanding, copy of instructions and script given to pt. Pt d/c'd via wheelchair with belongings and escorted by hospital volunteer and wife.

## 2011-06-15 NOTE — Telephone Encounter (Signed)
LMLOM returning pt's call with the f/u appt to see Dr Dwain Sarna on 5/6 arrive at 8:15 for a 8:30.

## 2011-06-15 NOTE — Progress Notes (Signed)
3 Days Post-Op  Subjective:  Passing flatus, tol diet, no n/v  Objective: Vital signs in last 24 hours: Temp:  [97.7 F (36.5 C)-98.2 F (36.8 C)] 97.7 F (36.5 C) (04/26 0609) Pulse Rate:  [65-70] 65  (04/26 0609) Resp:  [14-20] 16  (04/26 0609) BP: (112-120)/(59-69) 117/64 mmHg (04/26 0609) SpO2:  [95 %-100 %] 98 % (04/26 0609) Last BM Date: 06/11/11  Intake/Output from previous day: 04/25 0701 - 04/26 0700 In: 1740 [P.O.:840; I.V.:900] Out: -  Intake/Output this shift:    General appearance: no distress Resp: clear to auscultation bilaterally Cardio: regular rate and rhythm GI: soft, approp tender, bs present wound clean  Lab Results:   Basename 06/15/11 0700 06/13/11 0450  WBC 8.9 11.1*  HGB 13.3 13.6  HCT 38.3* 39.7  PLT 213 185   BMET  Basename 06/14/11 0649 06/13/11 0450  NA 138 139  K 3.5 3.3*  CL 99 101  CO2 30 28  GLUCOSE 102* 96  BUN 5* 9  CREATININE 0.87 0.94  CALCIUM 9.6 8.9   PT/INR No results found for this basename: LABPROT:2,INR:2 in the last 72 hours ABG No results found for this basename: PHART:2,PCO2:2,PO2:2,HCO3:2 in the last 72 hours  Studies/Results: No results found.  Anti-infectives: Anti-infectives     Start     Dose/Rate Route Frequency Ordered Stop   06/11/11 1423   ertapenem (INVANZ) 1 g in sodium chloride 0.9 % 50 mL IVPB        1 g 100 mL/hr over 30 Minutes Intravenous 60 min pre-op 06/11/11 1423 06/12/11 0730          Assessment/Plan: POD # 3 lap right colon Regular diet If tolerates dc home Path is tva with hgd, discussed this am    LOS: 3 days    [facility] 06/15/2011

## 2011-06-25 ENCOUNTER — Ambulatory Visit (INDEPENDENT_AMBULATORY_CARE_PROVIDER_SITE_OTHER): Payer: 59 | Admitting: General Surgery

## 2011-06-25 ENCOUNTER — Encounter (INDEPENDENT_AMBULATORY_CARE_PROVIDER_SITE_OTHER): Payer: Self-pay | Admitting: General Surgery

## 2011-06-25 VITALS — BP 144/88 | HR 82 | Resp 16 | Ht 73.0 in | Wt 222.0 lb

## 2011-06-25 DIAGNOSIS — Z09 Encounter for follow-up examination after completed treatment for conditions other than malignant neoplasm: Secondary | ICD-10-CM

## 2011-06-25 NOTE — Patient Instructions (Signed)
May return to work on 13th and full activity in one month after surgery

## 2011-06-25 NOTE — Progress Notes (Signed)
Subjective:     Patient ID: Robert Powers, male   DOB: 1955/04/18, 56 y.o.   MRN: [id number]  HPI 89 yom s/p lap right colectomy for unresectable polyp.  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  He was discharged pod 3 and is doing well since.  He is tolerating diet, having bms with no complaints.  Review of Systems     Objective:   Physical Exam Well healing incisions, I removed staples and applied steri-strips     Assessment:     S/p lap right colon for TVA    Plan:     Return to work May 13, full activity 23 May Return to see me as needed Follow up [facility] per Dr Dulce Sellar

## 2011-07-03 NOTE — Discharge Summary (Signed)
Physician Discharge Summary  Patient ID: Robert Powers MRN: [id number] DOB/AGE: June 19, 1955 56 y.o.  Admit date: 06/12/2011 Discharge date: 07/03/2011  Admission Diagnoses: Right colon polyp unresectable by endoscopy  Discharge Diagnoses:  Active Problems:  * No active hospital problems. *    Discharged Condition good  Hospital Course: 59 yom who underwent colonoscopy with finding of right sided polyp that was unable to be resected endoscopically.  He underwent a bowel prep at home and was admitted where he underwent a laparoscopic right colectomy.  By postop day three, he was having flatus and bowel movements.  His pain was controlled, incision was clean and he was discharged home  Consults: none  Significant Diagnostic Studies: none  Treatments: laparoscopic right colectomy  Disposition: 01-Home or Self Care   Medication List  As of 07/03/2011  9:29 PM   TAKE these medications         allopurinol 300 MG tablet   Commonly known as: ZYLOPRIM   Take 300 mg by mouth daily.      desloratadine-pseudoephedrine 5-240 MG per 24 hr tablet   Commonly known as: CLARINEX-D 24-hour   Take 1 tablet by mouth daily.      diltiazem 180 MG 24 hr capsule   Commonly known as: CARDIZEM CD   Take 180 mg by mouth daily.      hydrochlorothiazide 25 MG tablet   Commonly known as: HYDRODIURIL   Take 25 mg by mouth daily.      nebivolol 10 MG tablet   Commonly known as: BYSTOLIC   Take 10 mg by mouth daily.      rosuvastatin 10 MG tablet   Commonly known as: CRESTOR   Take 5 mg by mouth daily.           Follow-up Information    Follow up with [facility], MD. Schedule an appointment as soon as possible for a visit in 2 weeks.   Contact information:   [employer] [place] [address] [place] [address] [phone]          Signed: Emelia Loron 07/03/2011, 9:29 PM

## 2011-07-05 ENCOUNTER — Ambulatory Visit (INDEPENDENT_AMBULATORY_CARE_PROVIDER_SITE_OTHER): Payer: 59

## 2011-07-05 DIAGNOSIS — Z9889 Other specified postprocedural states: Secondary | ICD-10-CM

## 2011-07-05 DIAGNOSIS — Z9049 Acquired absence of other specified parts of digestive tract: Secondary | ICD-10-CM

## 2011-07-05 NOTE — Progress Notes (Signed)
Pt came in for a nurse only today b/c he thought he had a staple left behind from the middle incision but it was a suture trying to protrude thru the skin. I explained to the pt that this happens sometimes and we just try to clip the stitch at skin level to see if this will take care of the problem. I did warn the pt that sometimes the stitch will keep trying to work it self out and you might have to come back into office to have the area excised if not any better. I wiped the skin off with alcohol swab and took suture scissors to clip the stitch off at the skin. I applied triple antibiotic ointment and covered it with a gauze. I told the pt to call if area gets worse with redness or drainage. The pt understands.

## 2011-07-10 ENCOUNTER — Encounter (INDEPENDENT_AMBULATORY_CARE_PROVIDER_SITE_OTHER): Payer: Self-pay

## 2012-01-04 ENCOUNTER — Other Ambulatory Visit (HOSPITAL_COMMUNITY): Payer: Self-pay | Admitting: Family Medicine

## 2012-01-04 DIAGNOSIS — R7989 Other specified abnormal findings of blood chemistry: Secondary | ICD-10-CM

## 2012-01-07 ENCOUNTER — Ambulatory Visit (HOSPITAL_COMMUNITY)
Admission: RE | Admit: 2012-01-07 | Discharge: 2012-01-07 | Disposition: A | Discharge status: Home / Self Care | Payer: 59 | Source: Ambulatory Visit | Attending: Family Medicine | Admitting: Family Medicine

## 2012-01-07 DIAGNOSIS — R7989 Other specified abnormal findings of blood chemistry: Secondary | ICD-10-CM

## 2012-01-07 DIAGNOSIS — K7689 Other specified diseases of liver: Secondary | ICD-10-CM | POA: Insufficient documentation

## 2013-05-14 ENCOUNTER — Other Ambulatory Visit: Payer: Self-pay | Admitting: Gastroenterology

## 2015-08-01 DIAGNOSIS — E782 Mixed hyperlipidemia: Secondary | ICD-10-CM | POA: Diagnosis not present

## 2015-08-01 DIAGNOSIS — M109 Gout, unspecified: Secondary | ICD-10-CM | POA: Diagnosis not present

## 2015-08-01 DIAGNOSIS — I10 Essential (primary) hypertension: Secondary | ICD-10-CM | POA: Diagnosis not present

## 2016-03-12 DIAGNOSIS — Z125 Encounter for screening for malignant neoplasm of prostate: Secondary | ICD-10-CM | POA: Diagnosis not present

## 2016-03-12 DIAGNOSIS — Z23 Encounter for immunization: Secondary | ICD-10-CM | POA: Diagnosis not present

## 2016-03-12 DIAGNOSIS — E782 Mixed hyperlipidemia: Secondary | ICD-10-CM | POA: Diagnosis not present

## 2016-03-12 DIAGNOSIS — I10 Essential (primary) hypertension: Secondary | ICD-10-CM | POA: Diagnosis not present

## 2016-03-12 DIAGNOSIS — M109 Gout, unspecified: Secondary | ICD-10-CM | POA: Diagnosis not present

## 2016-03-12 DIAGNOSIS — Z0000 Encounter for general adult medical examination without abnormal findings: Secondary | ICD-10-CM | POA: Diagnosis not present

## 2016-03-12 DIAGNOSIS — J069 Acute upper respiratory infection, unspecified: Secondary | ICD-10-CM | POA: Diagnosis not present

## 2016-07-31 ENCOUNTER — Ambulatory Visit
Admission: RE | Admit: 2016-07-31 | Discharge: 2016-07-31 | Disposition: A | Discharge status: Home / Self Care | Payer: BLUE CROSS/BLUE SHIELD | Source: Ambulatory Visit | Attending: Physician Assistant | Admitting: Physician Assistant

## 2016-07-31 ENCOUNTER — Other Ambulatory Visit: Payer: Self-pay | Admitting: Physician Assistant

## 2016-07-31 DIAGNOSIS — R0602 Shortness of breath: Secondary | ICD-10-CM

## 2016-08-03 ENCOUNTER — Other Ambulatory Visit: Payer: Self-pay | Admitting: Physician Assistant

## 2016-08-03 DIAGNOSIS — R911 Solitary pulmonary nodule: Secondary | ICD-10-CM

## 2016-08-07 ENCOUNTER — Ambulatory Visit
Admission: RE | Admit: 2016-08-07 | Discharge: 2016-08-07 | Disposition: A | Discharge status: Home / Self Care | Payer: BLUE CROSS/BLUE SHIELD | Source: Ambulatory Visit | Attending: Physician Assistant | Admitting: Physician Assistant

## 2016-08-07 DIAGNOSIS — R911 Solitary pulmonary nodule: Secondary | ICD-10-CM

## 2016-08-07 MED ORDER — IOPAMIDOL (ISOVUE-300) INJECTION 61%
75.0000 mL | Freq: Once | INTRAVENOUS | Status: AC | PRN
  Administered 2016-08-07: 75 mL via INTRAVENOUS

## 2016-08-08 ENCOUNTER — Other Ambulatory Visit (HOSPITAL_COMMUNITY): Payer: Self-pay | Admitting: Physician Assistant

## 2016-08-08 DIAGNOSIS — Z85819 Personal history of malignant neoplasm of unspecified site of lip, oral cavity, and pharynx: Secondary | ICD-10-CM

## 2016-08-08 DIAGNOSIS — Z87891 Personal history of nicotine dependence: Secondary | ICD-10-CM

## 2016-08-08 DIAGNOSIS — R911 Solitary pulmonary nodule: Secondary | ICD-10-CM

## 2016-08-09 ENCOUNTER — Telehealth: Payer: Self-pay | Admitting: Internal Medicine

## 2016-08-09 ENCOUNTER — Other Ambulatory Visit (HOSPITAL_COMMUNITY): Payer: Self-pay | Admitting: Physician Assistant

## 2016-08-09 DIAGNOSIS — R1313 Dysphagia, pharyngeal phase: Secondary | ICD-10-CM | POA: Diagnosis not present

## 2016-08-09 NOTE — Telephone Encounter (Signed)
[facility] to call office but they closed at [facility]. Will need to try to call office tomorrow.

## 2016-08-10 NOTE — Telephone Encounter (Signed)
lmtcb x1 with [facility] referral department.

## 2016-08-13 NOTE — Telephone Encounter (Signed)
Attempted to call [facility] and was on hold for about 5 mins.  Will need to call back

## 2016-08-13 NOTE — Telephone Encounter (Signed)
Will wait until schedule is open so that patient can be scheduled.

## 2016-08-13 NOTE — Telephone Encounter (Signed)
Have Robert Powers open up the first available Thursday pm clinic spot

## 2016-08-13 NOTE — Telephone Encounter (Signed)
Spoke with referrals dept. Stated the patient had a CT chest done on 6/19 at [facility] and had several nodules that appeared to be suspicious. Patient has a history of throat cancer. Patient is currently scheduled to see MW on 09/11/16 at 3pm.   They wanted to know if it was any way possible for patient to be seen sooner. MW, please advise. Thanks.

## 2016-08-15 ENCOUNTER — Encounter
Admission: RE | Admit: 2016-08-15 | Discharge: 2016-08-15 | Disposition: A | Discharge status: Home / Self Care | Payer: BLUE CROSS/BLUE SHIELD | Source: Ambulatory Visit | Attending: Physician Assistant | Admitting: Physician Assistant

## 2016-08-15 DIAGNOSIS — Z85819 Personal history of malignant neoplasm of unspecified site of lip, oral cavity, and pharynx: Secondary | ICD-10-CM | POA: Insufficient documentation

## 2016-08-15 DIAGNOSIS — Z87891 Personal history of nicotine dependence: Secondary | ICD-10-CM | POA: Diagnosis not present

## 2016-08-15 DIAGNOSIS — R911 Solitary pulmonary nodule: Secondary | ICD-10-CM | POA: Insufficient documentation

## 2016-08-15 DIAGNOSIS — R918 Other nonspecific abnormal finding of lung field: Secondary | ICD-10-CM | POA: Diagnosis not present

## 2016-08-15 LAB — GLUCOSE, CAPILLARY: Glucose-Capillary: 96 mg/dL (ref 65–99)

## 2016-08-15 MED ORDER — FLUDEOXYGLUCOSE F - 18 (FDG) INJECTION
13.3800 | Freq: Once | INTRAVENOUS | Status: AC | PRN
  Administered 2016-08-15: 13.38 via INTRAVENOUS

## 2016-08-15 NOTE — Telephone Encounter (Signed)
I have called and lmomtcb x 1 for the pt.  MW schedule can be used for 7/5 in the pm.

## 2016-08-15 NOTE — Telephone Encounter (Signed)
lmom tcb x1 to [facility] referral department.  Please see pt's previous message

## 2016-08-15 NOTE — Telephone Encounter (Signed)
Patient returned call and was not sure why he was needing an appointment here, as he states he has an appointment at [facility] 08/17/2016 @ 8:00 am, which he believes is for the issue he is being referred to our office for.  He is going to call the referring provider to confirm he still needs an appointment with [place] and call us back.  He does state he would be able to come 08/23/16 pm if still needs this appointment.  Patient's CB is [phone], but states he will call us back once he speaks with his physician.

## 2016-08-17 DIAGNOSIS — R918 Other nonspecific abnormal finding of lung field: Secondary | ICD-10-CM | POA: Diagnosis not present

## 2016-08-17 NOTE — Telephone Encounter (Signed)
lmtcb for referral department with [facility] at Triage.

## 2016-08-17 NOTE — Telephone Encounter (Signed)
Attempted to contact [facility] at [facility]. Their office is not open yet this AM. Message will be routed to triage to follow up on later this AM.

## 2016-08-20 NOTE — Telephone Encounter (Signed)
LMOMTCB X 2 for the pt to call us back---need to see if he spoke with his doctor at [facility]  To see if they still wanted him to come in on Friday here for his appt.

## 2016-08-20 NOTE — Telephone Encounter (Signed)
Enid Derry from [facility] called and states patient has been seen at [facility], so we may close referral to our office as patient no longer needs an appt with [place],  CB is [phone].  No call back is needed.  I will close referral..Marland KitchenMarland KitchenShelby.

## 2016-08-23 DIAGNOSIS — Z8589 Personal history of malignant neoplasm of other organs and systems: Secondary | ICD-10-CM | POA: Diagnosis not present

## 2016-08-23 DIAGNOSIS — Z96643 Presence of artificial hip joint, bilateral: Secondary | ICD-10-CM | POA: Diagnosis not present

## 2016-08-23 DIAGNOSIS — Z888 Allergy status to other drugs, medicaments and biological substances status: Secondary | ICD-10-CM | POA: Diagnosis not present

## 2016-08-23 DIAGNOSIS — C3490 Malignant neoplasm of unspecified part of unspecified bronchus or lung: Secondary | ICD-10-CM | POA: Diagnosis not present

## 2016-08-23 DIAGNOSIS — I10 Essential (primary) hypertension: Secondary | ICD-10-CM | POA: Diagnosis not present

## 2016-08-23 DIAGNOSIS — Z79899 Other long term (current) drug therapy: Secondary | ICD-10-CM | POA: Diagnosis not present

## 2016-08-23 DIAGNOSIS — R918 Other nonspecific abnormal finding of lung field: Secondary | ICD-10-CM | POA: Diagnosis not present

## 2016-08-23 DIAGNOSIS — C771 Secondary and unspecified malignant neoplasm of intrathoracic lymph nodes: Secondary | ICD-10-CM | POA: Diagnosis not present

## 2016-08-29 ENCOUNTER — Telehealth: Payer: Self-pay | Admitting: *Deleted

## 2016-08-29 NOTE — Telephone Encounter (Signed)
Oncology Nurse Navigator Documentation  Oncology Nurse Navigator Flowsheets 08/29/2016  Referral date to RadOnc/MedOnc 08/28/2016  Navigator Encounter Type Telephone/I received referral on Robert Powers yesterday.  I called to give him an appt to see Dr. Julien Nordmann. I was unable to reach him and left vm message with my name and phone number to call.   Telephone Outgoing Call  Treatment Phase Abnormal Scans  Barriers/Navigation Needs Coordination of Care  Interventions Coordination of Care  Coordination of Care Appts  Acuity Level 1  Time Spent with Patient 15

## 2016-08-30 ENCOUNTER — Telehealth: Payer: Self-pay | Admitting: *Deleted

## 2016-08-30 DIAGNOSIS — I639 Cerebral infarction, unspecified: Secondary | ICD-10-CM | POA: Diagnosis not present

## 2016-08-30 DIAGNOSIS — C3490 Malignant neoplasm of unspecified part of unspecified bronchus or lung: Secondary | ICD-10-CM | POA: Diagnosis not present

## 2016-08-30 NOTE — Telephone Encounter (Signed)
Oncology Nurse Navigator Documentation  Oncology Nurse Navigator Flowsheets 08/30/2016  Navigator Location [facility]  Navigator Encounter Type Telephone/per patient's request, I called wife to set up an appt to be seen with Dr. Julien Nordmann. I called and spoke with the wife. She states her husband is getting treatment at [facility].  I listened as she explained.  I updated her that we are here to help.   Telephone Outgoing Call  Treatment Phase Abnormal Scans  Barriers/Navigation Needs Coordination of Care  Interventions Coordination of Care  Coordination of Care Appts  Acuity Level 1  Time Spent with Patient 15

## 2016-08-31 DIAGNOSIS — C3490 Malignant neoplasm of unspecified part of unspecified bronchus or lung: Secondary | ICD-10-CM | POA: Diagnosis not present

## 2016-09-03 DIAGNOSIS — Z5111 Encounter for antineoplastic chemotherapy: Secondary | ICD-10-CM | POA: Diagnosis not present

## 2016-09-03 DIAGNOSIS — C3490 Malignant neoplasm of unspecified part of unspecified bronchus or lung: Secondary | ICD-10-CM | POA: Diagnosis not present

## 2016-09-04 DIAGNOSIS — C3490 Malignant neoplasm of unspecified part of unspecified bronchus or lung: Secondary | ICD-10-CM | POA: Diagnosis not present

## 2016-09-04 DIAGNOSIS — Z5111 Encounter for antineoplastic chemotherapy: Secondary | ICD-10-CM | POA: Diagnosis not present

## 2016-09-04 DIAGNOSIS — Z79899 Other long term (current) drug therapy: Secondary | ICD-10-CM | POA: Diagnosis not present

## 2016-09-05 DIAGNOSIS — Z5111 Encounter for antineoplastic chemotherapy: Secondary | ICD-10-CM | POA: Diagnosis not present

## 2016-09-05 DIAGNOSIS — Z79899 Other long term (current) drug therapy: Secondary | ICD-10-CM | POA: Diagnosis not present

## 2016-09-05 DIAGNOSIS — C3490 Malignant neoplasm of unspecified part of unspecified bronchus or lung: Secondary | ICD-10-CM | POA: Diagnosis not present

## 2016-09-11 ENCOUNTER — Institutional Professional Consult (permissible substitution): Payer: BLUE CROSS/BLUE SHIELD | Admitting: Internal Medicine

## 2016-09-24 ENCOUNTER — Encounter: Payer: Self-pay | Admitting: Radiation Oncology

## 2016-09-24 DIAGNOSIS — E876 Hypokalemia: Secondary | ICD-10-CM | POA: Diagnosis not present

## 2016-09-24 DIAGNOSIS — C3491 Malignant neoplasm of unspecified part of right bronchus or lung: Secondary | ICD-10-CM | POA: Diagnosis not present

## 2016-09-24 DIAGNOSIS — Z79899 Other long term (current) drug therapy: Secondary | ICD-10-CM | POA: Diagnosis not present

## 2016-09-24 DIAGNOSIS — C3490 Malignant neoplasm of unspecified part of unspecified bronchus or lung: Secondary | ICD-10-CM | POA: Diagnosis not present

## 2016-09-25 DIAGNOSIS — C3490 Malignant neoplasm of unspecified part of unspecified bronchus or lung: Secondary | ICD-10-CM | POA: Diagnosis not present

## 2016-09-25 DIAGNOSIS — Z5111 Encounter for antineoplastic chemotherapy: Secondary | ICD-10-CM | POA: Diagnosis not present

## 2016-09-26 DIAGNOSIS — Z5111 Encounter for antineoplastic chemotherapy: Secondary | ICD-10-CM | POA: Diagnosis not present

## 2016-09-26 DIAGNOSIS — C3490 Malignant neoplasm of unspecified part of unspecified bronchus or lung: Secondary | ICD-10-CM | POA: Diagnosis not present

## 2016-09-26 NOTE — Progress Notes (Signed)
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  Tobacco/Marijuana/Snuff/ETOH use: quit smoking in 2008, drinks 2-3 beers per day  Past/Anticipated interventions by cardiothoracic surgery, if any: no  Past/Anticipated interventions by medical oncology, if any: Concurrent CRT with weekly carboplatin/etoposide initiated on 09/03/16 at [facility]  His last chemotherapy cycle was yesterday.  He has 2 more scheduled.  Signs/Symptoms  Weight changes, if any: no  Respiratory complaints, if any: some shortness of breath, coughing very little  Hemoptysis, if any: no  Pain issues, if any:  no  SAFETY ISSUES: Prior radiation? 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 no   Possible current pregnancy?no  Is the patient on methotrexate? no  Current Complaints / other details:  Patient is here with his wife.  BP 123/83 (BP Location: Right Arm, Patient Position: Sitting)   Pulse 61   Temp 98.4 F (36.9 C) (Oral)   Ht 6\' 1"  (1.854 m)   Wt 227 lb 3.2 oz (103.1 kg)   SpO2 98%   BMI 29.98 kg/m    Wt Readings from Last 3 Encounters:  09/27/16 227 lb 3.2 oz (103.1 kg)  06/25/11 222 lb (100.7 kg)  06/12/11 221 lb (100.2 kg)

## 2016-09-27 ENCOUNTER — Ambulatory Visit
Admission: RE | Admit: 2016-09-27 | Discharge: 2016-09-27 | Disposition: A | Discharge status: Home / Self Care | Payer: BLUE CROSS/BLUE SHIELD | Source: Ambulatory Visit | Attending: Radiation Oncology | Admitting: Radiation Oncology

## 2016-09-27 ENCOUNTER — Encounter: Payer: Self-pay | Admitting: Radiation Oncology

## 2016-09-27 VITALS — BP 123/83 | HR 61 | Temp 98.4°F | Ht 73.0 in | Wt 227.2 lb

## 2016-09-27 DIAGNOSIS — C3411 Malignant neoplasm of upper lobe, right bronchus or lung: Secondary | ICD-10-CM | POA: Insufficient documentation

## 2016-09-27 DIAGNOSIS — Z510 Encounter for antineoplastic radiation therapy: Secondary | ICD-10-CM | POA: Insufficient documentation

## 2016-09-27 DIAGNOSIS — Z8521 Personal history of malignant neoplasm of larynx: Secondary | ICD-10-CM | POA: Diagnosis not present

## 2016-09-27 DIAGNOSIS — Z923 Personal history of irradiation: Secondary | ICD-10-CM | POA: Diagnosis not present

## 2016-09-27 DIAGNOSIS — C3491 Malignant neoplasm of unspecified part of right bronchus or lung: Secondary | ICD-10-CM

## 2016-09-27 DIAGNOSIS — C3481 Malignant neoplasm of overlapping sites of right bronchus and lung: Secondary | ICD-10-CM | POA: Diagnosis not present

## 2016-09-27 DIAGNOSIS — Z87891 Personal history of nicotine dependence: Secondary | ICD-10-CM | POA: Diagnosis not present

## 2016-09-27 HISTORY — DX: Malignant neoplasm of unspecified part of unspecified bronchus or lung: C34.90
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
## 2016-09-27 NOTE — Progress Notes (Signed)
Please see the Nurse Progress Note in the MD Initial Consult Encounter for this patient. 

## 2016-09-28 ENCOUNTER — Ambulatory Visit
Admission: RE | Admit: 2016-09-28 | Discharge: 2016-09-28 | Disposition: A | Discharge status: Home / Self Care | Payer: BLUE CROSS/BLUE SHIELD | Source: Ambulatory Visit | Attending: Radiation Oncology | Admitting: Radiation Oncology

## 2016-09-28 DIAGNOSIS — Z510 Encounter for antineoplastic radiation therapy: Secondary | ICD-10-CM | POA: Diagnosis not present

## 2016-09-28 DIAGNOSIS — C3411 Malignant neoplasm of upper lobe, right bronchus or lung: Secondary | ICD-10-CM

## 2016-09-30 NOTE — Progress Notes (Signed)
  Radiation Oncology         [phone] ________________________________  Name: Robert Powers MRN: [id number]  Date: 09/28/2016  DOB: 06/03/1955  SIMULATION AND TREATMENT PLANNING NOTE    ICD-10-CM   1. Small cell lung cancer, right upper lobe (HCC) C34.11     DIAGNOSIS:  Limited stage small cell carcinoma of right lung  NARRATIVE:  The patient was brought to the [facility].  Identity was confirmed.  All relevant records and images related to the planned course of therapy were reviewed.  The patient freely provided informed written consent to proceed with treatment after reviewing the details related to the planned course of therapy. The consent form was witnessed and verified by the simulation staff.  Then, the patient was set-up in a stable reproducible  supine position for radiation therapy.  CT images were obtained.  Surface markings were placed.  The CT images were loaded into the planning software.  Then the target and avoidance structures were contoured.  Treatment planning then occurred.  The radiation prescription was entered and confirmed.  Then, I designed and supervised the construction of a total of 5 medically necessary complex treatment devices.  I have requested : 3D Simulation  I have requested a DVH of the following structures: Heart, lungs, lumpectomy cavity, esophagus, brachial plexus.  I have ordered:dose calc.  PLAN:  The patient will receive 60 Gy in 30 fractions along with radiosensitizing chemotherapy  Special Treatment Procedure Note: The patient will be receiving radiosensitizing chemotherapy. Given the potential of increased toxicities related to combined therapy and the necessity for close monitoring of the patient and blood work, this constitutes a special treatment procedure.  -----------------------------------  Blair Promise, PhD, MD  -----------------------------------

## 2016-10-01 DIAGNOSIS — C3411 Malignant neoplasm of upper lobe, right bronchus or lung: Secondary | ICD-10-CM | POA: Diagnosis not present

## 2016-10-01 DIAGNOSIS — Z510 Encounter for antineoplastic radiation therapy: Secondary | ICD-10-CM | POA: Diagnosis not present

## 2016-10-03 ENCOUNTER — Ambulatory Visit
Admission: RE | Admit: 2016-10-03 | Discharge: 2016-10-03 | Disposition: A | Discharge status: Home / Self Care | Payer: BLUE CROSS/BLUE SHIELD | Source: Ambulatory Visit | Attending: Radiation Oncology | Admitting: Radiation Oncology

## 2016-10-03 DIAGNOSIS — C3411 Malignant neoplasm of upper lobe, right bronchus or lung: Secondary | ICD-10-CM | POA: Diagnosis not present

## 2016-10-03 DIAGNOSIS — Z510 Encounter for antineoplastic radiation therapy: Secondary | ICD-10-CM | POA: Diagnosis not present

## 2016-10-03 NOTE — Progress Notes (Signed)
  Radiation Oncology         (336) [phone] ________________________________  Name: Robert Powers MRN: [id number]  Date: 10/03/2016  DOB: August 12, 1955  Simulation Verification Note    ICD-10-CM   1. Small cell lung cancer, right upper lobe (HCC) C34.11     Status: outpatient  NARRATIVE: The patient was brought to the treatment unit and placed in the planned treatment position. The clinical setup was verified. Then port films were obtained and uploaded to the radiation oncology medical record software.  The treatment beams were carefully compared against the planned radiation fields. The position location and shape of the radiation fields was reviewed. They targeted volume of tissue appears to be appropriately covered by the radiation beams. Organs at risk appear to be excluded as planned.  Based on my personal review, I approved the simulation verification. The patient's treatment will proceed as planned.  -----------------------------------  Blair Promise, PhD, MD

## 2016-10-04 ENCOUNTER — Ambulatory Visit
Admission: RE | Admit: 2016-10-04 | Discharge: 2016-10-04 | Disposition: A | Discharge status: Home / Self Care | Payer: BLUE CROSS/BLUE SHIELD | Source: Ambulatory Visit | Attending: Radiation Oncology | Admitting: Radiation Oncology

## 2016-10-04 DIAGNOSIS — Z510 Encounter for antineoplastic radiation therapy: Secondary | ICD-10-CM | POA: Diagnosis not present

## 2016-10-04 DIAGNOSIS — C3411 Malignant neoplasm of upper lobe, right bronchus or lung: Secondary | ICD-10-CM | POA: Diagnosis not present

## 2016-10-05 ENCOUNTER — Ambulatory Visit
Admission: RE | Admit: 2016-10-05 | Discharge: 2016-10-05 | Disposition: A | Discharge status: Home / Self Care | Payer: BLUE CROSS/BLUE SHIELD | Source: Ambulatory Visit | Attending: Radiation Oncology | Admitting: Radiation Oncology

## 2016-10-05 DIAGNOSIS — C3411 Malignant neoplasm of upper lobe, right bronchus or lung: Secondary | ICD-10-CM | POA: Diagnosis not present

## 2016-10-05 DIAGNOSIS — Z510 Encounter for antineoplastic radiation therapy: Secondary | ICD-10-CM | POA: Diagnosis not present

## 2016-10-08 ENCOUNTER — Ambulatory Visit
Admission: RE | Admit: 2016-10-08 | Discharge: 2016-10-08 | Disposition: A | Discharge status: Home / Self Care | Payer: BLUE CROSS/BLUE SHIELD | Source: Ambulatory Visit | Attending: Radiation Oncology | Admitting: Radiation Oncology

## 2016-10-08 DIAGNOSIS — C3411 Malignant neoplasm of upper lobe, right bronchus or lung: Secondary | ICD-10-CM | POA: Diagnosis not present

## 2016-10-08 DIAGNOSIS — Z510 Encounter for antineoplastic radiation therapy: Secondary | ICD-10-CM | POA: Diagnosis not present

## 2016-10-09 ENCOUNTER — Ambulatory Visit
Admission: RE | Admit: 2016-10-09 | Discharge: 2016-10-09 | Disposition: A | Discharge status: Home / Self Care | Payer: BLUE CROSS/BLUE SHIELD | Source: Ambulatory Visit | Attending: Radiation Oncology | Admitting: Radiation Oncology

## 2016-10-09 DIAGNOSIS — C3411 Malignant neoplasm of upper lobe, right bronchus or lung: Secondary | ICD-10-CM

## 2016-10-09 DIAGNOSIS — Z510 Encounter for antineoplastic radiation therapy: Secondary | ICD-10-CM | POA: Diagnosis not present

## 2016-10-09 MED ORDER — SONAFINE EX EMUL
1.0000 "application " | Freq: Once | CUTANEOUS | Status: AC
  Administered 2016-10-09: 1 via TOPICAL

## 2016-10-09 NOTE — Progress Notes (Signed)
Pt here for patient teaching.  Pt given Radiation and You booklet and Sonafine.  Reviewed areas of pertinence such as fatigue, skin changes, throat changes, cough and shortness of breath . Pt able to give teach back of to pat skin and use unscented/gentle soap,apply Sonafine bid and avoid applying anything to skin within 4 hours of treatment. Pt demonstrated understanding and verbalizes understanding of information given and will contact nursing with any questions or concerns.

## 2016-10-10 ENCOUNTER — Ambulatory Visit
Admission: RE | Admit: 2016-10-10 | Discharge: 2016-10-10 | Disposition: A | Discharge status: Home / Self Care | Payer: BLUE CROSS/BLUE SHIELD | Source: Ambulatory Visit | Attending: Radiation Oncology | Admitting: Radiation Oncology

## 2016-10-10 DIAGNOSIS — Z510 Encounter for antineoplastic radiation therapy: Secondary | ICD-10-CM | POA: Diagnosis not present

## 2016-10-10 DIAGNOSIS — C3411 Malignant neoplasm of upper lobe, right bronchus or lung: Secondary | ICD-10-CM | POA: Diagnosis not present

## 2016-10-11 ENCOUNTER — Ambulatory Visit
Admission: RE | Admit: 2016-10-11 | Discharge: 2016-10-11 | Disposition: A | Discharge status: Home / Self Care | Payer: BLUE CROSS/BLUE SHIELD | Source: Ambulatory Visit | Attending: Radiation Oncology | Admitting: Radiation Oncology

## 2016-10-11 DIAGNOSIS — C3411 Malignant neoplasm of upper lobe, right bronchus or lung: Secondary | ICD-10-CM | POA: Diagnosis not present

## 2016-10-11 DIAGNOSIS — Z510 Encounter for antineoplastic radiation therapy: Secondary | ICD-10-CM | POA: Diagnosis not present

## 2016-10-12 ENCOUNTER — Ambulatory Visit
Admission: RE | Admit: 2016-10-12 | Discharge: 2016-10-12 | Disposition: A | Discharge status: Home / Self Care | Payer: BLUE CROSS/BLUE SHIELD | Source: Ambulatory Visit | Attending: Radiation Oncology | Admitting: Radiation Oncology

## 2016-10-12 DIAGNOSIS — Z510 Encounter for antineoplastic radiation therapy: Secondary | ICD-10-CM | POA: Diagnosis not present

## 2016-10-12 DIAGNOSIS — C3411 Malignant neoplasm of upper lobe, right bronchus or lung: Secondary | ICD-10-CM | POA: Diagnosis not present

## 2016-10-15 ENCOUNTER — Ambulatory Visit
Admission: RE | Admit: 2016-10-15 | Discharge: 2016-10-15 | Disposition: A | Discharge status: Home / Self Care | Payer: BLUE CROSS/BLUE SHIELD | Source: Ambulatory Visit | Attending: Radiation Oncology | Admitting: Radiation Oncology

## 2016-10-15 DIAGNOSIS — C3411 Malignant neoplasm of upper lobe, right bronchus or lung: Secondary | ICD-10-CM | POA: Diagnosis not present

## 2016-10-15 DIAGNOSIS — Z510 Encounter for antineoplastic radiation therapy: Secondary | ICD-10-CM | POA: Diagnosis not present

## 2016-10-15 DIAGNOSIS — Z79899 Other long term (current) drug therapy: Secondary | ICD-10-CM | POA: Diagnosis not present

## 2016-10-15 DIAGNOSIS — C3490 Malignant neoplasm of unspecified part of unspecified bronchus or lung: Secondary | ICD-10-CM | POA: Diagnosis not present

## 2016-10-16 ENCOUNTER — Other Ambulatory Visit: Payer: Self-pay | Admitting: Radiation Oncology

## 2016-10-16 ENCOUNTER — Ambulatory Visit
Admission: RE | Admit: 2016-10-16 | Discharge: 2016-10-16 | Disposition: A | Discharge status: Home / Self Care | Payer: BLUE CROSS/BLUE SHIELD | Source: Ambulatory Visit | Attending: Radiation Oncology | Admitting: Radiation Oncology

## 2016-10-16 ENCOUNTER — Telehealth: Payer: Self-pay | Admitting: Oncology

## 2016-10-16 DIAGNOSIS — C3411 Malignant neoplasm of upper lobe, right bronchus or lung: Secondary | ICD-10-CM | POA: Diagnosis not present

## 2016-10-16 DIAGNOSIS — Z5111 Encounter for antineoplastic chemotherapy: Secondary | ICD-10-CM | POA: Diagnosis not present

## 2016-10-16 DIAGNOSIS — C3490 Malignant neoplasm of unspecified part of unspecified bronchus or lung: Secondary | ICD-10-CM | POA: Diagnosis not present

## 2016-10-16 DIAGNOSIS — Z510 Encounter for antineoplastic radiation therapy: Secondary | ICD-10-CM | POA: Diagnosis not present

## 2016-10-16 MED ORDER — SUCRALFATE 1 G PO TABS: 1.0000 g | ORAL_TABLET | Freq: Three times a day (TID) | ORAL | 1 refills | Status: DC

## 2016-10-16 NOTE — Telephone Encounter (Signed)
Per patient, Carafate prescription called in to [facility] in [place].

## 2016-10-17 ENCOUNTER — Ambulatory Visit
Admission: RE | Admit: 2016-10-17 | Discharge: 2016-10-17 | Disposition: A | Discharge status: Home / Self Care | Payer: BLUE CROSS/BLUE SHIELD | Source: Ambulatory Visit | Attending: Radiation Oncology | Admitting: Radiation Oncology

## 2016-10-17 DIAGNOSIS — C3411 Malignant neoplasm of upper lobe, right bronchus or lung: Secondary | ICD-10-CM | POA: Diagnosis not present

## 2016-10-17 DIAGNOSIS — C3490 Malignant neoplasm of unspecified part of unspecified bronchus or lung: Secondary | ICD-10-CM | POA: Diagnosis not present

## 2016-10-17 DIAGNOSIS — Z5111 Encounter for antineoplastic chemotherapy: Secondary | ICD-10-CM | POA: Diagnosis not present

## 2016-10-17 DIAGNOSIS — Z510 Encounter for antineoplastic radiation therapy: Secondary | ICD-10-CM | POA: Diagnosis not present

## 2016-10-18 ENCOUNTER — Ambulatory Visit
Admission: RE | Admit: 2016-10-18 | Discharge: 2016-10-18 | Disposition: A | Discharge status: Home / Self Care | Payer: BLUE CROSS/BLUE SHIELD | Source: Ambulatory Visit | Attending: Radiation Oncology | Admitting: Radiation Oncology

## 2016-10-18 DIAGNOSIS — C3411 Malignant neoplasm of upper lobe, right bronchus or lung: Secondary | ICD-10-CM | POA: Diagnosis not present

## 2016-10-18 DIAGNOSIS — Z510 Encounter for antineoplastic radiation therapy: Secondary | ICD-10-CM | POA: Diagnosis not present

## 2016-10-19 ENCOUNTER — Ambulatory Visit
Admission: RE | Admit: 2016-10-19 | Discharge: 2016-10-19 | Disposition: A | Discharge status: Home / Self Care | Payer: BLUE CROSS/BLUE SHIELD | Source: Ambulatory Visit | Attending: Radiation Oncology | Admitting: Radiation Oncology

## 2016-10-19 DIAGNOSIS — C3411 Malignant neoplasm of upper lobe, right bronchus or lung: Secondary | ICD-10-CM | POA: Diagnosis not present

## 2016-10-19 DIAGNOSIS — Z510 Encounter for antineoplastic radiation therapy: Secondary | ICD-10-CM | POA: Diagnosis not present

## 2016-10-23 ENCOUNTER — Ambulatory Visit
Admission: RE | Admit: 2016-10-23 | Discharge: 2016-10-23 | Disposition: A | Discharge status: Home / Self Care | Payer: BLUE CROSS/BLUE SHIELD | Source: Ambulatory Visit | Attending: Radiation Oncology | Admitting: Radiation Oncology

## 2016-10-23 DIAGNOSIS — C3411 Malignant neoplasm of upper lobe, right bronchus or lung: Secondary | ICD-10-CM | POA: Diagnosis not present

## 2016-10-23 DIAGNOSIS — Z510 Encounter for antineoplastic radiation therapy: Secondary | ICD-10-CM | POA: Diagnosis not present

## 2016-10-24 ENCOUNTER — Ambulatory Visit
Admission: RE | Admit: 2016-10-24 | Discharge: 2016-10-24 | Disposition: A | Discharge status: Home / Self Care | Payer: BLUE CROSS/BLUE SHIELD | Source: Ambulatory Visit | Attending: Radiation Oncology | Admitting: Radiation Oncology

## 2016-10-24 DIAGNOSIS — Z510 Encounter for antineoplastic radiation therapy: Secondary | ICD-10-CM | POA: Diagnosis not present

## 2016-10-24 DIAGNOSIS — C3411 Malignant neoplasm of upper lobe, right bronchus or lung: Secondary | ICD-10-CM | POA: Diagnosis not present

## 2016-10-25 ENCOUNTER — Ambulatory Visit
Admission: RE | Admit: 2016-10-25 | Discharge: 2016-10-25 | Disposition: A | Discharge status: Home / Self Care | Payer: BLUE CROSS/BLUE SHIELD | Source: Ambulatory Visit | Attending: Radiation Oncology | Admitting: Radiation Oncology

## 2016-10-25 DIAGNOSIS — C3411 Malignant neoplasm of upper lobe, right bronchus or lung: Secondary | ICD-10-CM | POA: Diagnosis not present

## 2016-10-25 DIAGNOSIS — Z510 Encounter for antineoplastic radiation therapy: Secondary | ICD-10-CM | POA: Diagnosis not present

## 2016-10-26 ENCOUNTER — Ambulatory Visit
Admission: RE | Admit: 2016-10-26 | Discharge: 2016-10-26 | Disposition: A | Discharge status: Home / Self Care | Payer: BLUE CROSS/BLUE SHIELD | Source: Ambulatory Visit | Attending: Radiation Oncology | Admitting: Radiation Oncology

## 2016-10-26 DIAGNOSIS — C3411 Malignant neoplasm of upper lobe, right bronchus or lung: Secondary | ICD-10-CM | POA: Diagnosis not present

## 2016-10-26 DIAGNOSIS — Z510 Encounter for antineoplastic radiation therapy: Secondary | ICD-10-CM | POA: Diagnosis not present

## 2016-10-29 ENCOUNTER — Ambulatory Visit
Admission: RE | Admit: 2016-10-29 | Discharge: 2016-10-29 | Disposition: A | Discharge status: Home / Self Care | Payer: BLUE CROSS/BLUE SHIELD | Source: Ambulatory Visit | Attending: Radiation Oncology | Admitting: Radiation Oncology

## 2016-10-29 DIAGNOSIS — Z510 Encounter for antineoplastic radiation therapy: Secondary | ICD-10-CM | POA: Diagnosis not present

## 2016-10-29 DIAGNOSIS — C3411 Malignant neoplasm of upper lobe, right bronchus or lung: Secondary | ICD-10-CM | POA: Diagnosis not present

## 2016-10-30 ENCOUNTER — Ambulatory Visit
Admission: RE | Admit: 2016-10-30 | Discharge: 2016-10-30 | Disposition: A | Discharge status: Home / Self Care | Payer: BLUE CROSS/BLUE SHIELD | Source: Ambulatory Visit | Attending: Radiation Oncology | Admitting: Radiation Oncology

## 2016-10-30 ENCOUNTER — Other Ambulatory Visit: Payer: Self-pay | Admitting: Radiation Oncology

## 2016-10-30 DIAGNOSIS — C3411 Malignant neoplasm of upper lobe, right bronchus or lung: Secondary | ICD-10-CM

## 2016-10-30 DIAGNOSIS — Z510 Encounter for antineoplastic radiation therapy: Secondary | ICD-10-CM | POA: Diagnosis not present

## 2016-10-30 MED ORDER — SONAFINE EX EMUL
1.0000 | Freq: Two times a day (BID) | CUTANEOUS | Status: DC
Start: 2016-10-30 — End: 2016-10-31
  Administered 2016-10-30: 1 via TOPICAL

## 2016-10-30 MED ORDER — OXYCODONE-ACETAMINOPHEN 5-325 MG PO TABS: 1.0000 | ORAL_TABLET | ORAL | 0 refills | Status: DC | PRN

## 2016-10-31 ENCOUNTER — Ambulatory Visit
Admission: RE | Admit: 2016-10-31 | Discharge: 2016-10-31 | Disposition: A | Discharge status: Home / Self Care | Payer: BLUE CROSS/BLUE SHIELD | Source: Ambulatory Visit | Attending: Radiation Oncology | Admitting: Radiation Oncology

## 2016-10-31 DIAGNOSIS — C3411 Malignant neoplasm of upper lobe, right bronchus or lung: Secondary | ICD-10-CM | POA: Diagnosis not present

## 2016-10-31 DIAGNOSIS — Z510 Encounter for antineoplastic radiation therapy: Secondary | ICD-10-CM | POA: Diagnosis not present

## 2016-11-01 ENCOUNTER — Ambulatory Visit
Admission: RE | Admit: 2016-11-01 | Discharge: 2016-11-01 | Disposition: A | Discharge status: Home / Self Care | Payer: BLUE CROSS/BLUE SHIELD | Source: Ambulatory Visit | Attending: Radiation Oncology | Admitting: Radiation Oncology

## 2016-11-01 DIAGNOSIS — C3411 Malignant neoplasm of upper lobe, right bronchus or lung: Secondary | ICD-10-CM | POA: Diagnosis not present

## 2016-11-01 DIAGNOSIS — Z510 Encounter for antineoplastic radiation therapy: Secondary | ICD-10-CM | POA: Diagnosis not present

## 2016-11-02 ENCOUNTER — Ambulatory Visit
Admission: RE | Admit: 2016-11-02 | Discharge: 2016-11-02 | Disposition: A | Discharge status: Home / Self Care | Payer: BLUE CROSS/BLUE SHIELD | Source: Ambulatory Visit | Attending: Radiation Oncology | Admitting: Radiation Oncology

## 2016-11-02 DIAGNOSIS — C3411 Malignant neoplasm of upper lobe, right bronchus or lung: Secondary | ICD-10-CM | POA: Diagnosis not present

## 2016-11-02 DIAGNOSIS — Z510 Encounter for antineoplastic radiation therapy: Secondary | ICD-10-CM | POA: Diagnosis not present

## 2016-11-05 ENCOUNTER — Ambulatory Visit
Admission: RE | Admit: 2016-11-05 | Discharge: 2016-11-05 | Disposition: A | Discharge status: Home / Self Care | Payer: BLUE CROSS/BLUE SHIELD | Source: Ambulatory Visit | Attending: Radiation Oncology | Admitting: Radiation Oncology

## 2016-11-05 DIAGNOSIS — C3490 Malignant neoplasm of unspecified part of unspecified bronchus or lung: Secondary | ICD-10-CM | POA: Diagnosis not present

## 2016-11-05 DIAGNOSIS — Z79899 Other long term (current) drug therapy: Secondary | ICD-10-CM | POA: Diagnosis not present

## 2016-11-05 DIAGNOSIS — Z510 Encounter for antineoplastic radiation therapy: Secondary | ICD-10-CM | POA: Diagnosis not present

## 2016-11-05 DIAGNOSIS — C3411 Malignant neoplasm of upper lobe, right bronchus or lung: Secondary | ICD-10-CM | POA: Diagnosis not present

## 2016-11-06 ENCOUNTER — Ambulatory Visit
Admission: RE | Admit: 2016-11-06 | Discharge: 2016-11-06 | Disposition: A | Discharge status: Home / Self Care | Payer: BLUE CROSS/BLUE SHIELD | Source: Ambulatory Visit | Attending: Radiation Oncology | Admitting: Radiation Oncology

## 2016-11-06 DIAGNOSIS — C3490 Malignant neoplasm of unspecified part of unspecified bronchus or lung: Secondary | ICD-10-CM | POA: Diagnosis not present

## 2016-11-06 DIAGNOSIS — Z510 Encounter for antineoplastic radiation therapy: Secondary | ICD-10-CM | POA: Diagnosis not present

## 2016-11-06 DIAGNOSIS — Z5111 Encounter for antineoplastic chemotherapy: Secondary | ICD-10-CM | POA: Diagnosis not present

## 2016-11-06 DIAGNOSIS — C3411 Malignant neoplasm of upper lobe, right bronchus or lung: Secondary | ICD-10-CM | POA: Diagnosis not present

## 2016-11-06 MED ORDER — SONAFINE EX EMUL
1.0000 "application " | Freq: Once | CUTANEOUS | Status: AC
  Administered 2016-11-06: 1 via TOPICAL

## 2016-11-07 ENCOUNTER — Ambulatory Visit
Admission: RE | Admit: 2016-11-07 | Discharge: 2016-11-07 | Disposition: A | Discharge status: Home / Self Care | Payer: BLUE CROSS/BLUE SHIELD | Source: Ambulatory Visit | Attending: Radiation Oncology | Admitting: Radiation Oncology

## 2016-11-07 DIAGNOSIS — C3490 Malignant neoplasm of unspecified part of unspecified bronchus or lung: Secondary | ICD-10-CM | POA: Diagnosis not present

## 2016-11-07 DIAGNOSIS — C3411 Malignant neoplasm of upper lobe, right bronchus or lung: Secondary | ICD-10-CM | POA: Diagnosis not present

## 2016-11-07 DIAGNOSIS — Z5111 Encounter for antineoplastic chemotherapy: Secondary | ICD-10-CM | POA: Diagnosis not present

## 2016-11-07 DIAGNOSIS — Z510 Encounter for antineoplastic radiation therapy: Secondary | ICD-10-CM | POA: Diagnosis not present

## 2016-11-08 ENCOUNTER — Ambulatory Visit
Admission: RE | Admit: 2016-11-08 | Discharge: 2016-11-08 | Disposition: A | Discharge status: Home / Self Care | Payer: BLUE CROSS/BLUE SHIELD | Source: Ambulatory Visit | Attending: Radiation Oncology | Admitting: Radiation Oncology

## 2016-11-08 DIAGNOSIS — Z510 Encounter for antineoplastic radiation therapy: Secondary | ICD-10-CM | POA: Diagnosis not present

## 2016-11-08 DIAGNOSIS — C3411 Malignant neoplasm of upper lobe, right bronchus or lung: Secondary | ICD-10-CM | POA: Diagnosis not present

## 2016-11-09 ENCOUNTER — Ambulatory Visit
Admission: RE | Admit: 2016-11-09 | Discharge: 2016-11-09 | Disposition: A | Discharge status: Home / Self Care | Payer: BLUE CROSS/BLUE SHIELD | Source: Ambulatory Visit | Attending: Radiation Oncology | Admitting: Radiation Oncology

## 2016-11-09 DIAGNOSIS — C3411 Malignant neoplasm of upper lobe, right bronchus or lung: Secondary | ICD-10-CM | POA: Diagnosis not present

## 2016-11-09 DIAGNOSIS — Z510 Encounter for antineoplastic radiation therapy: Secondary | ICD-10-CM | POA: Diagnosis not present

## 2016-11-12 ENCOUNTER — Ambulatory Visit
Admission: RE | Admit: 2016-11-12 | Discharge: 2016-11-12 | Disposition: A | Discharge status: Home / Self Care | Payer: BLUE CROSS/BLUE SHIELD | Source: Ambulatory Visit | Attending: Radiation Oncology | Admitting: Radiation Oncology

## 2016-11-12 DIAGNOSIS — Z510 Encounter for antineoplastic radiation therapy: Secondary | ICD-10-CM | POA: Diagnosis not present

## 2016-11-12 DIAGNOSIS — C3411 Malignant neoplasm of upper lobe, right bronchus or lung: Secondary | ICD-10-CM | POA: Diagnosis not present

## 2016-11-13 ENCOUNTER — Ambulatory Visit
Admission: RE | Admit: 2016-11-13 | Discharge: 2016-11-13 | Disposition: A | Discharge status: Home / Self Care | Payer: BLUE CROSS/BLUE SHIELD | Source: Ambulatory Visit | Attending: Radiation Oncology | Admitting: Radiation Oncology

## 2016-11-13 DIAGNOSIS — C3411 Malignant neoplasm of upper lobe, right bronchus or lung: Secondary | ICD-10-CM | POA: Diagnosis not present

## 2016-11-13 DIAGNOSIS — Z510 Encounter for antineoplastic radiation therapy: Secondary | ICD-10-CM | POA: Diagnosis not present

## 2016-11-14 ENCOUNTER — Ambulatory Visit
Admission: RE | Admit: 2016-11-14 | Discharge: 2016-11-14 | Disposition: A | Discharge status: Home / Self Care | Payer: BLUE CROSS/BLUE SHIELD | Source: Ambulatory Visit | Attending: Radiation Oncology | Admitting: Radiation Oncology

## 2016-11-14 DIAGNOSIS — Z510 Encounter for antineoplastic radiation therapy: Secondary | ICD-10-CM | POA: Diagnosis not present

## 2016-11-14 DIAGNOSIS — C3411 Malignant neoplasm of upper lobe, right bronchus or lung: Secondary | ICD-10-CM | POA: Diagnosis not present

## 2016-11-21 ENCOUNTER — Encounter: Payer: Self-pay | Admitting: Radiation Oncology

## 2016-11-21 NOTE — Progress Notes (Signed)
  Radiation Oncology         (336) [phone] ________________________________  Name: Robert Powers MRN: [id number]  Date: 11/21/2016  DOB: 07-05-1955  End of Treatment Note  Diagnosis: Limited stage small cell carcinoma of right lung     Indication for treatment:  Curative       Radiation treatment dates:  10/03/16-11/14/16  Site/dose:  Right lung/ 60 Gy in 30 fractions  Beams/energy:   3D/ 10X, 15X  Narrative: The patient tolerated radiation treatment relatively well. During treatment, the patient noted increased shortness of breath with activity. He noted decreased coughing. He had erythema and dry desquamation to the upper back region.  Plan: The patient has completed radiation treatment. The patient will return to radiation oncology clinic for routine followup in one month. I advised them to call or return sooner if they have any questions or concerns related to their recovery or treatment.  -----------------------------------  Blair Promise, PhD, MD  This document serves as a record of services personally performed by Gery Pray, MD. It was created on his behalf by Bethann Humble, a trained medical scribe. The creation of this record is based on the scribe's personal observations and the provider's statements to them. This document has been checked and approved by the attending provider.

## 2016-12-14 ENCOUNTER — Encounter: Payer: Self-pay | Admitting: Oncology

## 2016-12-17 ENCOUNTER — Ambulatory Visit
Admission: RE | Admit: 2016-12-17 | Discharge: 2016-12-17 | Disposition: A | Discharge status: Home / Self Care | Payer: BLUE CROSS/BLUE SHIELD | Source: Ambulatory Visit | Attending: Radiation Oncology | Admitting: Radiation Oncology

## 2016-12-17 VITALS — BP 140/83 | HR 80 | Temp 98.4°F | Ht 73.0 in | Wt 232.6 lb

## 2016-12-17 DIAGNOSIS — Z510 Encounter for antineoplastic radiation therapy: Secondary | ICD-10-CM | POA: Diagnosis not present

## 2016-12-17 DIAGNOSIS — C3411 Malignant neoplasm of upper lobe, right bronchus or lung: Secondary | ICD-10-CM | POA: Diagnosis not present

## 2016-12-17 NOTE — Progress Notes (Addendum)
Robert Powers is here for follow up.  He denies having pain except for arthritis in his hands.  He reports that his shortness of breath has improved.  He reports having an occasional dry cough.  He continues to have a burning feeling in his throat with swallowing that has improved.  He is taking Carafate and Nexium.  He reports feeling tired.   The skin on his right back is intact.  BP 140/83 (BP Location: Left Arm, Patient Position: Sitting)   Pulse 80   Temp 98.4 F (36.9 C) (Oral)   Ht 6\' 1"  (1.854 m)   Wt 232 lb 9.6 oz (105.5 kg)   SpO2 99%   BMI 30.69 kg/m    Wt Readings from Last 3 Encounters:  12/17/16 232 lb 9.6 oz (105.5 kg)  09/27/16 227 lb 3.2 oz (103.1 kg)  06/25/11 222 lb (100.7 kg)

## 2016-12-17 NOTE — Progress Notes (Signed)
Radiation Oncology         [phone] ________________________________  Name: Robert Powers MRN: [id number]  Date: 12/17/2016  DOB: 01/27/1956  Follow-Up Visit Note  CC: Maury Dus, MD  Santiago Glad, MD    ICD-10-CM   1. Small cell lung cancer, right upper lobe (HCC) C34.11     Diagnosis:   Limited stage small cell carcinoma of right lung     Interval Since Last Radiation:  1 months 3 days  Narrative:  The patient returns today for routine follow-up of the right lung. His final radiation treatment was 11/14/2016. Pt presents to the office today accompanied by his wife. He reports that he is doing well overall. He reports some mild fatigue and GERD. He is currently taking Nexium and Carafate for the indigestion symptoms. He is eating solid foods at this time and states he can eat anything he wants. He states he is scheduled for a PET scan with [facility] next month but has not had any imaging since completing treatment. Spoke to patient about prophylactic cranial radiation and patient declined. Explained to patient that he would need to undergo regular brain MRIs to detect any new disease that could possibly present.   On review of systems, he reports mild fatigue and GERD. He denies hemoptysis, SOB, skin irritation. breathing overall much better after chemotherapy and radiation therapy.                               ALLERGIES:  is allergic to aspirin; doxazosin mesylate; itraconazole; norvasc [amlodipine besylate]; tekturna [aliskiren fumarate]; zestoretic [lisinopril-hydrochlorothiazide]; and benicar [olmesartan medoxomil].  Meds: Current Outpatient Prescriptions  Medication Sig Dispense Refill  . allopurinol (ZYLOPRIM) 300 MG tablet Take 300 mg by mouth daily.     [name] diltiazem (CARDIZEM CD) 180 MG 24 hr capsule Take 180 mg by mouth daily.     [name] esomeprazole (NEXIUM) 20 MG capsule Take 20 mg by mouth daily at 12 noon.    . fenofibrate (TRICOR) 145 MG tablet Take 145 mg by  mouth.    . fluticasone (FLONASE) 50 MCG/ACT nasal spray U 1 SPRAY IEN QPM  5  . hydrochlorothiazide (HYDRODIURIL) 25 MG tablet Take 25 mg by mouth daily.      . Multiple Vitamin (MULTI-VITAMINS) TABS Take by mouth.    . nebivolol (BYSTOLIC) 10 MG tablet Take 10 mg by mouth daily.     . rosuvastatin (CRESTOR) 10 MG tablet Take 5 mg by mouth daily.      . sucralfate (CARAFATE) 1 g tablet Take 1 tablet (1 g total) by mouth 4 (four) times daily -  with meals and at bedtime. Dissolve in 10-15 cc water prior to swallowing 120 tablet 1  . albuterol (PROAIR HFA) 108 (90 Base) MCG/ACT inhaler Inhale into the lungs.    [name] desloratadine-pseudoephedrine (CLARINEX-D 24-HOUR) 5-240 MG per 24 hr tablet Take 1 tablet by mouth daily.      [name] oxyCODONE-acetaminophen (PERCOCET/ROXICET) 5-325 MG tablet Take 1 tablet by mouth every 4 (four) hours as needed for severe pain. (Patient not taking: Reported on 12/17/2016) 20 tablet 0   No current facility-administered medications for this encounter.     Physical Findings: The patient is in no acute distress. Patient is alert and oriented.  height is 6\' 1"  (1.854 m) and weight is 232 lb 9.6 oz (105.5 kg). His oral temperature is 98.4 F (36.9 C). His blood pressure is  140/83 and his pulse is 80. His oxygen saturation is 99%. .    Lungs are clear to auscultation bilaterally. Heart has regular rate and rhythm. No palpable cervical, supraclavicular, or axillary adenopathy. Abdomen soft, non-tender, normal bowel sounds. Skin along chest has healed well.    Lab Findings: Lab Results  Component Value Date   WBC 8.9 06/15/2011   HGB 13.3 06/15/2011   HCT 38.3 (L) 06/15/2011   MCV 101.9 (H) 06/15/2011   PLT 213 06/15/2011    Radiographic Findings: No results found.  Impression:  The patient is recovering from the effects of radiation. Patient will proceed with PET scan at [facility] in mid November. Today we discussed consideration for prophylactic cranial radiation  in the event that he has had a complete response to his radiation therapy. If pt is found to have had a complete response he does not wish to proceed with cranial radiation for fear of dementia/memory issues. I have suggested we repeat his brain MRI in the next couple months since his last brain MRI was in July 2018 at [facility]:  Patient will return in early December for exam and blood work in preparation for his brain MRI.   -----------------------------------  Blair Promise, PhD, MD  This document serves as a record of services personally performed by Gery Pray, MD. It was created on his behalf by Marlowe Kays, a trained medical scribe. The creation of this record is based on the scribe's personal observations and the provider's statements to them. This document has been checked and approved by the attending provider.

## 2017-01-04 DIAGNOSIS — R590 Localized enlarged lymph nodes: Secondary | ICD-10-CM | POA: Diagnosis not present

## 2017-01-04 DIAGNOSIS — R591 Generalized enlarged lymph nodes: Secondary | ICD-10-CM | POA: Diagnosis not present

## 2017-01-04 DIAGNOSIS — R918 Other nonspecific abnormal finding of lung field: Secondary | ICD-10-CM | POA: Diagnosis not present

## 2017-01-04 DIAGNOSIS — C3490 Malignant neoplasm of unspecified part of unspecified bronchus or lung: Secondary | ICD-10-CM | POA: Diagnosis not present

## 2017-01-04 DIAGNOSIS — J700 Acute pulmonary manifestations due to radiation: Secondary | ICD-10-CM | POA: Diagnosis not present

## 2017-01-14 DIAGNOSIS — Z85118 Personal history of other malignant neoplasm of bronchus and lung: Secondary | ICD-10-CM | POA: Diagnosis not present

## 2017-01-14 DIAGNOSIS — C3490 Malignant neoplasm of unspecified part of unspecified bronchus or lung: Secondary | ICD-10-CM | POA: Diagnosis not present

## 2017-01-24 DIAGNOSIS — R05 Cough: Secondary | ICD-10-CM | POA: Diagnosis not present

## 2017-01-24 DIAGNOSIS — C3411 Malignant neoplasm of upper lobe, right bronchus or lung: Secondary | ICD-10-CM | POA: Diagnosis not present

## 2017-02-04 ENCOUNTER — Ambulatory Visit: Payer: BLUE CROSS/BLUE SHIELD | Admitting: Radiation Oncology

## 2017-02-04 ENCOUNTER — Telehealth: Payer: Self-pay | Admitting: *Deleted

## 2017-02-04 ENCOUNTER — Telehealth: Payer: Self-pay | Admitting: Oncology

## 2017-02-04 NOTE — Telephone Encounter (Signed)
CALLED PATIENT TO INFORM THAT FU APPT. HAS BEEN RESCHEDULED, LVM FOR A RETURN CALL

## 2017-02-04 NOTE — Telephone Encounter (Signed)
Called patient regarding his follow up appointment today.  Requested a return call.

## 2017-02-07 ENCOUNTER — Encounter: Payer: Self-pay | Admitting: Radiation Oncology

## 2017-02-07 ENCOUNTER — Ambulatory Visit
Admission: RE | Admit: 2017-02-07 | Discharge: 2017-02-07 | Disposition: A | Discharge status: Home / Self Care | Payer: BLUE CROSS/BLUE SHIELD | Source: Ambulatory Visit | Attending: Radiation Oncology | Admitting: Radiation Oncology

## 2017-02-07 ENCOUNTER — Telehealth: Payer: Self-pay | Admitting: Oncology

## 2017-02-07 DIAGNOSIS — C3411 Malignant neoplasm of upper lobe, right bronchus or lung: Secondary | ICD-10-CM

## 2017-02-07 DIAGNOSIS — Z510 Encounter for antineoplastic radiation therapy: Secondary | ICD-10-CM | POA: Diagnosis not present

## 2017-02-07 MED ORDER — HYDROCODONE-HOMATROPINE 5-1.5 MG/5ML PO SYRP: 5.0000 mL | ORAL_SOLUTION | Freq: Four times a day (QID) | ORAL | 0 refills | Status: DC | PRN

## 2017-02-07 NOTE — Progress Notes (Signed)
Robert Powers is here for follow up after treatment to his right lung.  He denies having pain.  He is having a lot of shortness of breath and a frequent dry cough which started about 4 weeks ago.  He is taking mucinex and delsym without relief.  He denies having any hemoptysis.  He denies having any trouble swallowing.  He said he always feels like there is always a lump in his throat since he has had throat cancer.  He reports having fatigue.  He denies having any skin irritation in the treatment area.  BP 131/62 (BP Location: Left Arm, Patient Position: Sitting)   Pulse 88   Temp 98.3 F (36.8 C) (Oral)   Ht 6\' 1"  (1.854 m)   Wt 238 lb 3.2 oz (108 kg)   SpO2 97%   BMI 31.43 kg/m    Wt Readings from Last 3 Encounters:  02/07/17 238 lb 3.2 oz (108 kg)  12/17/16 232 lb 9.6 oz (105.5 kg)  09/27/16 227 lb 3.2 oz (103.1 kg)

## 2017-02-07 NOTE — Telephone Encounter (Signed)
Called in Medrol Dosepak to [facility] in [place] per Dr. Sondra Come.

## 2017-02-07 NOTE — Progress Notes (Signed)
Radiation Oncology         [phone] ________________________________  Name: Robert Powers MRN: [id number]  Date: 02/07/2017  DOB: 19-Sep-1955  Follow-Up Visit Note  CC: Maury Dus, MD  Santiago Glad, MD    ICD-10-CM   1. Small cell lung cancer, right upper lobe (HCC) C34.11     Diagnosis:   61 y.o. 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  He had a CT C/A/P done on 01/04/2017 that showed the bulk of mediastinal and right hilar lymphadenopathy has significantly improved, as well as resolution of multiple nodules in the right upper lobe. There are several nodular opacities within the right upper and right middle lobes, which are new from prior studies and nonspecific. Follow-up CT recommended in 1-2 months. 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                 Today, he reports he has recently finished chemotherapy and is having fatigue but denies any pain. He reports significant shortness of breath and a frequent dry cough that started about 4 weeks ago. He is taking mucinex and delsym without relief. He denies hemoptysis or chest pain. He reports headaches. He denies having any trouble with swallowing. He denies any skin irritation in the treatment area.   ALLERGIES:  is allergic to aspirin; doxazosin mesylate; itraconazole; norvasc [amlodipine besylate]; tekturna [aliskiren fumarate]; zestoretic [lisinopril-hydrochlorothiazide]; and benicar [olmesartan medoxomil].  Meds: Current Outpatient Medications  Medication Sig Dispense Refill  . albuterol (PROAIR HFA) 108 (90 Base) MCG/ACT inhaler Inhale into the lungs.    [name] allopurinol (ZYLOPRIM) 300 MG tablet Take 300 mg by mouth daily.     [name] Dextromethorphan-Menthol (DELSYM COUGH RELIEF MT) Use as directed in the mouth or throat.    .  diltiazem (CARDIZEM CD) 180 MG 24 hr capsule Take 180 mg by mouth daily.     [name] esomeprazole (NEXIUM) 20 MG capsule Take 20 mg by mouth daily at 12 noon.    . fenofibrate (TRICOR) 145 MG tablet Take 145 mg by mouth.    . fluticasone (FLONASE) 50 MCG/ACT nasal spray U 1 SPRAY IEN QPM  5  . guaiFENesin (MUCINEX) 600 MG 12 hr tablet Take by mouth 2 (two) times daily.    . hydrochlorothiazide (HYDRODIURIL) 25 MG tablet Take 25 mg by mouth daily.      . Multiple Vitamin (MULTI-VITAMINS) TABS Take by mouth.    . nebivolol (BYSTOLIC) 10 MG tablet Take 10 mg by mouth daily.     . rosuvastatin (CRESTOR) 10 MG tablet Take 5 mg by mouth daily.      [name] desloratadine-pseudoephedrine (CLARINEX-D 24-HOUR) 5-240 MG per 24 hr tablet Take 1 tablet by mouth daily.      [name] oxyCODONE-acetaminophen (PERCOCET/ROXICET) 5-325 MG tablet Take 1 tablet by mouth every 4 (four) hours as needed for severe pain. (Patient not taking: Reported on 12/17/2016) 20 tablet 0  . sucralfate (CARAFATE) 1 g tablet Take 1 tablet (1 g total) by mouth 4 (four) times daily -  with meals and at bedtime. Dissolve in 10-15 cc water prior to swallowing (Patient not taking: Reported on 02/07/2017) 120 tablet 1   No current facility-administered medications for this encounter.    Review of Systems: REVIEW OF SYSTEMS: A 10+ POINT REVIEW OF SYSTEMS  WAS OBTAINED including neurology, dermatology, psychiatry, cardiac, respiratory, lymph, extremities, GI, GU, musculoskeletal, constitutional, reproductive, HEENT. All pertinent positives are noted in the HPI. All others are negative.  Physical Findings: The patient is in no acute distress. Patient is alert and oriented.  height is 6\' 1"  (1.854 m) and weight is 238 lb 3.2 oz (108 kg). His oral temperature is 98.3 F (36.8 C). His blood pressure is 131/62 and his pulse is 88. His oxygen saturation is 97%.   Lungs are clear to auscultation bilaterally. Heart has regular rate and rhythm. No palpable cervical,  supraclavicular, or axillary adenopathy. Abdomen soft, non-tender, normal bowel sounds.  Lab Findings: Lab Results  Component Value Date   WBC 8.9 06/15/2011   HGB 13.3 06/15/2011   HCT 38.3 (L) 06/15/2011   MCV 101.9 (H) 06/15/2011   PLT 213 06/15/2011    Radiographic Findings: No results found.  Impression:  Limited stage small cell carcinoma of right lung. Recent scan showed complete response to radiation and chemotherapy. Scan report from [facility] did show probable radiation pneumonitis in right upper lobe. He is symptomatic with coughing and shortness of breath. No reports of fever or productive cough. His cough can be debilitating at times.  Plan:  The patient will be placed on a Medrol Dosepak for presumed radiation pneumonitis. He will also be placed on Hycodan for dry cough. He has tried OTC cough medicines without success. Patient will return on 02/28/2017 for follow-up and to assess his response to steroids. Patient reaffirm that he does not wish to have prophylactic cranial radiation. He will have repeat body scans in February at [facility] as well as an MRI the brain at that time.   ________________________________  Blair Promise, PhD, MD  This document serves as a record of services personally performed by Gery Pray, MD. It was created on his behalf by Rae Lips, a trained medical scribe. The creation of this record is based on the scribe's personal observations and the provider's statements to them. This document has been checked and approved by the attending provider.

## 2017-02-18 ENCOUNTER — Other Ambulatory Visit: Payer: Self-pay | Admitting: Radiation Oncology

## 2017-02-18 ENCOUNTER — Telehealth: Payer: Self-pay | Admitting: Oncology

## 2017-02-18 MED ORDER — HYDROCODONE-HOMATROPINE 5-1.5 MG/5ML PO SYRP: 5.0000 mL | ORAL_SOLUTION | Freq: Four times a day (QID) | ORAL | 0 refills | Status: DC | PRN

## 2017-02-18 NOTE — Telephone Encounter (Signed)
Patient's wife called and requested a refill of hycodan.  She said patient is still coughing and the hycodan helps.  He has finished the medrol dose pak.

## 2017-02-25 DIAGNOSIS — M109 Gout, unspecified: Secondary | ICD-10-CM | POA: Diagnosis not present

## 2017-02-25 DIAGNOSIS — I10 Essential (primary) hypertension: Secondary | ICD-10-CM | POA: Diagnosis not present

## 2017-02-25 DIAGNOSIS — E782 Mixed hyperlipidemia: Secondary | ICD-10-CM | POA: Diagnosis not present

## 2017-02-28 ENCOUNTER — Other Ambulatory Visit: Payer: Self-pay

## 2017-02-28 ENCOUNTER — Ambulatory Visit
Admission: RE | Admit: 2017-02-28 | Discharge: 2017-02-28 | Disposition: A | Discharge status: Home / Self Care | Payer: BLUE CROSS/BLUE SHIELD | Source: Ambulatory Visit | Attending: Radiation Oncology | Admitting: Radiation Oncology

## 2017-02-28 ENCOUNTER — Other Ambulatory Visit: Payer: Self-pay | Admitting: Radiation Oncology

## 2017-02-28 ENCOUNTER — Encounter: Payer: Self-pay | Admitting: Radiation Oncology

## 2017-02-28 DIAGNOSIS — C3411 Malignant neoplasm of upper lobe, right bronchus or lung: Secondary | ICD-10-CM

## 2017-02-28 DIAGNOSIS — R0602 Shortness of breath: Secondary | ICD-10-CM | POA: Diagnosis not present

## 2017-02-28 DIAGNOSIS — Z510 Encounter for antineoplastic radiation therapy: Secondary | ICD-10-CM | POA: Diagnosis not present

## 2017-02-28 DIAGNOSIS — Z08 Encounter for follow-up examination after completed treatment for malignant neoplasm: Secondary | ICD-10-CM | POA: Diagnosis not present

## 2017-02-28 DIAGNOSIS — Z85118 Personal history of other malignant neoplasm of bronchus and lung: Secondary | ICD-10-CM | POA: Diagnosis not present

## 2017-02-28 MED ORDER — PREDNISONE 10 MG PO TABS: ORAL_TABLET | ORAL | 0 refills | Status: DC

## 2017-02-28 NOTE — Progress Notes (Signed)
Radiation Oncology         [phone] ________________________________  Name: Robert Powers MRN: [id number]  Date: 02/28/2017  DOB: 1956/02/08  Follow-Up Visit Note  CC: Maury Dus, MD  Santiago Glad, MD    ICD-10-CM   1. Small cell lung cancer, right upper lobe (HCC) C34.11     Diagnosis:   Limited stage small cell carcinoma of right lung  Interval Since Last Radiation:  4 months  10/03/2016 - 11/14/2016: Right Lung / 60 Gy in 30 fractions   Narrative:  The patient was placed on a Medrol Dosepak at last f/u for presumed radiation pneumonitis. He reports that this did not  feel any significant changes with this prescription. He does feel his breathing has been stable and has not worsened. He denies any chest pain or hemoptysis.Robert Powers He was also placed on Hycodan for his cough. He has decided against prophylactic cranial radiation and awaits repeat body scans next month at [facility].   Patient has been able to work his usual schedule. 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     On review of systems, patient denies pain but endorses SOB upon exertion. Denies chest pains, fever. Continues to endorse dry cough but state hycodan was helpful at 1-2 times a day dose.  Endorses fatigue in afternoon now that he is back to work. Denies known hx of COPD. Endorses hx of pneumonias but no interstitial cystitis.  ALLERGIES:  is allergic to aspirin; doxazosin mesylate; itraconazole; norvasc [amlodipine besylate]; tekturna [aliskiren fumarate]; zestoretic [lisinopril-hydrochlorothiazide]; and benicar [olmesartan medoxomil].  Meds: Current Outpatient Medications  Medication Sig Dispense Refill  . albuterol (PROAIR HFA) 108 (90 Base) MCG/ACT inhaler Inhale into the lungs.    Robert Powers allopurinol (ZYLOPRIM) 300 MG tablet Take 300 mg by mouth daily.     Robert Powers Dextromethorphan-Menthol (DELSYM COUGH RELIEF MT) Use as directed in the mouth or throat.    . diltiazem (CARDIZEM CD) 180 MG 24 hr  capsule Take 180 mg by mouth daily.     Robert Powers esomeprazole (NEXIUM) 20 MG capsule Take 20 mg by mouth daily at 12 noon.    . fenofibrate (TRICOR) 145 MG tablet Take 145 mg by mouth.    . fluticasone (FLONASE) 50 MCG/ACT nasal spray U 1 SPRAY IEN QPM  5  . guaiFENesin (MUCINEX) 600 MG 12 hr tablet Take by mouth 2 (two) times daily.    . hydrochlorothiazide (HYDRODIURIL) 25 MG tablet Take 25 mg by mouth daily.      Robert Powers HYDROcodone-homatropine (HYCODAN) 5-1.5 MG/5ML syrup Take 5 mLs by mouth every 6 (six) hours as needed for cough. 120 mL 0  . Multiple Vitamin (MULTI-VITAMINS) TABS Take by mouth.    . nebivolol (BYSTOLIC) 10 MG tablet Take 10 mg by mouth daily.     . rosuvastatin (CRESTOR) 10 MG tablet Take 5 mg by mouth daily.      Robert Powers desloratadine-pseudoephedrine (CLARINEX-D 24-HOUR) 5-240 MG per 24 hr tablet Take 1 tablet by mouth daily.      . methylPREDNISolone (MEDROL DOSEPAK) 4 MG TBPK tablet Take 4 mg by mouth.    . predniSONE (DELTASONE) 10 MG tablet Take 6 tabs po daily x 5 days, 5 tabs po x 5 days, 4 tabs po x 5 days, 3 tabs po x 5 days, 2 tabs po x 5 days, 1 tab po x5 days, then dc 105 tablet 0  . sucralfate (CARAFATE) 1 g tablet Take 1 tablet (1 g total)  by mouth 4 (four) times daily -  with meals and at bedtime. Dissolve in 10-15 cc water prior to swallowing (Patient not taking: Reported on 02/07/2017) 120 tablet 1   No current facility-administered medications for this encounter.     Physical Findings: The patient is in no acute distress. Patient is alert and oriented. Lungs are clear to auscultation bilaterally. Heart has regular rate and rhythm. No palpable cervical, supraclavicular, or axillary adenopathy. Abdomen soft, non-tender, normal bowel sounds.   height is 6\' 1"  (1.854 m) and weight is 235 lb 6.4 oz (106.8 kg). His oral temperature is 98.4 F (36.9 C). His blood pressure is 126/86 and his pulse is 83. His oxygen saturation is 97%.    Lab Findings: Lab Results  Component Value  Date   WBC 8.9 06/15/2011   HGB 13.3 06/15/2011   HCT 38.3 (L) 06/15/2011   MCV 101.9 (H) 06/15/2011   PLT 213 06/15/2011    Radiographic Findings: No results found.  Impression: Limited stage small cell lung cancer. Recent scans at [facility] showed the patient to be in remission. He decided not to proceed with prophylactic cranial irradiation for fear of memory/dementia issues. Patient was placed on Medrol Dosepak which did not seem to help significantly with his breathing issues. We discussed proceeding with a higher dose of prednisone (60 mg initially with a tapering dose ) and for longer duration to see if this would help with his symptoms. He is willing to consider this additional course of therapy.  Plan:  Patient is scheduled for chest CT and brain MRI at [facility] in February. He will see medical oncology next month there and follow up in radiation oncology in 1 month to assess his response to prednisone.  ____________________________________ Gery Pray, MD   This document serves as a record of services personally performed by Gery Pray, MD. It was created on his behalf by Linward Natal, a trained medical scribe. The creation of this record is based on the scribe's personal observations and the provider's statements to them. This document has been checked and approved by the attending provider.

## 2017-02-28 NOTE — Progress Notes (Addendum)
Romney is here for follow up.  He denies having pain.  He reports that he feels about the same.  He continues to have shortness of breath with any activity.  He said he still has a dry cough and said the hycodan really helped and was taking it 1-2 times per day.  He does need a refill.  He said the steroids did not really help. He has returned to work full time and reports feeling fatigued in the afternoon.  He will see his medical oncologist next month.  BP 126/86 (BP Location: Right Arm, Patient Position: Sitting)   Pulse 83   Temp 98.4 F (36.9 C) (Oral)   Ht 6\' 1"  (1.854 m)   Wt 235 lb 6.4 oz (106.8 kg)   SpO2 97%   BMI 31.06 kg/m    Wt Readings from Last 3 Encounters:  02/28/17 235 lb 6.4 oz (106.8 kg)  02/07/17 238 lb 3.2 oz (108 kg)  12/17/16 232 lb 9.6 oz (105.5 kg)

## 2017-03-01 ENCOUNTER — Telehealth: Payer: Self-pay | Admitting: Oncology

## 2017-03-01 NOTE — Telephone Encounter (Signed)
Patient's wife, Hilda Blades, called and requested a refill on hycodan.  Prescription refilled by Dr. Isidore Moos.  Let Hilda Blades know that it is available for pick up in the Radiation Oncology Nursing area.

## 2017-03-05 ENCOUNTER — Telehealth: Payer: Self-pay | Admitting: *Deleted

## 2017-03-05 NOTE — Telephone Encounter (Signed)
CALLED PATIENT TO INFORM OF FU ON 04-04-17, LVM FOR A RETURN CALL

## 2017-03-15 ENCOUNTER — Telehealth: Payer: Self-pay | Admitting: Oncology

## 2017-03-15 ENCOUNTER — Other Ambulatory Visit: Payer: Self-pay | Admitting: Urology

## 2017-03-15 DIAGNOSIS — C3411 Malignant neoplasm of upper lobe, right bronchus or lung: Secondary | ICD-10-CM

## 2017-03-15 MED ORDER — HYDROCODONE-HOMATROPINE 5-1.5 MG/5ML PO SYRP: 5.0000 mL | ORAL_SOLUTION | Freq: Four times a day (QID) | ORAL | 0 refills | Status: DC | PRN

## 2017-03-15 NOTE — Telephone Encounter (Signed)
Patient's wife called and requested a refill on his cough medication - Hycodan.  It was last filled on 03/01/17.

## 2017-03-22 DIAGNOSIS — Z923 Personal history of irradiation: Secondary | ICD-10-CM | POA: Diagnosis not present

## 2017-03-22 DIAGNOSIS — C3490 Malignant neoplasm of unspecified part of unspecified bronchus or lung: Secondary | ICD-10-CM | POA: Diagnosis not present

## 2017-03-22 DIAGNOSIS — C7931 Secondary malignant neoplasm of brain: Secondary | ICD-10-CM | POA: Diagnosis not present

## 2017-03-22 DIAGNOSIS — J0101 Acute recurrent maxillary sinusitis: Secondary | ICD-10-CM | POA: Diagnosis not present

## 2017-03-22 DIAGNOSIS — I6389 Other cerebral infarction: Secondary | ICD-10-CM | POA: Diagnosis not present

## 2017-03-22 DIAGNOSIS — C3411 Malignant neoplasm of upper lobe, right bronchus or lung: Secondary | ICD-10-CM | POA: Diagnosis not present

## 2017-03-22 DIAGNOSIS — I6781 Acute cerebrovascular insufficiency: Secondary | ICD-10-CM | POA: Diagnosis not present

## 2017-03-25 ENCOUNTER — Ambulatory Visit
Admission: RE | Admit: 2017-03-25 | Discharge: 2017-03-25 | Disposition: A | Discharge status: Home / Self Care | Payer: BLUE CROSS/BLUE SHIELD | Source: Ambulatory Visit | Attending: Radiation Oncology | Admitting: Radiation Oncology

## 2017-03-25 ENCOUNTER — Encounter: Payer: Self-pay | Admitting: Radiation Oncology

## 2017-03-25 ENCOUNTER — Ambulatory Visit
Admission: RE | Admit: 2017-03-25 | Discharge: 2017-03-25 | Disposition: A | Discharge status: Home / Self Care | Payer: Self-pay | Source: Ambulatory Visit | Attending: Radiation Oncology | Admitting: Radiation Oncology

## 2017-03-25 ENCOUNTER — Other Ambulatory Visit: Payer: Self-pay | Admitting: Radiation Oncology

## 2017-03-25 VITALS — BP 173/87 | HR 104 | Temp 99.1°F | Ht 73.0 in | Wt 228.2 lb

## 2017-03-25 DIAGNOSIS — C7931 Secondary malignant neoplasm of brain: Secondary | ICD-10-CM

## 2017-03-25 DIAGNOSIS — R05 Cough: Secondary | ICD-10-CM | POA: Insufficient documentation

## 2017-03-25 DIAGNOSIS — Z923 Personal history of irradiation: Secondary | ICD-10-CM | POA: Insufficient documentation

## 2017-03-25 DIAGNOSIS — Z8521 Personal history of malignant neoplasm of larynx: Secondary | ICD-10-CM | POA: Insufficient documentation

## 2017-03-25 DIAGNOSIS — R0602 Shortness of breath: Secondary | ICD-10-CM | POA: Diagnosis not present

## 2017-03-25 DIAGNOSIS — Z883 Allergy status to other anti-infective agents status: Secondary | ICD-10-CM | POA: Insufficient documentation

## 2017-03-25 DIAGNOSIS — Z888 Allergy status to other drugs, medicaments and biological substances status: Secondary | ICD-10-CM | POA: Insufficient documentation

## 2017-03-25 DIAGNOSIS — Z9221 Personal history of antineoplastic chemotherapy: Secondary | ICD-10-CM | POA: Insufficient documentation

## 2017-03-25 DIAGNOSIS — C3490 Malignant neoplasm of unspecified part of unspecified bronchus or lung: Secondary | ICD-10-CM | POA: Diagnosis not present

## 2017-03-25 DIAGNOSIS — C3411 Malignant neoplasm of upper lobe, right bronchus or lung: Secondary | ICD-10-CM

## 2017-03-25 DIAGNOSIS — Z7951 Long term (current) use of inhaled steroids: Secondary | ICD-10-CM | POA: Insufficient documentation

## 2017-03-25 DIAGNOSIS — Z7982 Long term (current) use of aspirin: Secondary | ICD-10-CM | POA: Diagnosis not present

## 2017-03-25 DIAGNOSIS — Z79899 Other long term (current) drug therapy: Secondary | ICD-10-CM | POA: Diagnosis not present

## 2017-03-25 DIAGNOSIS — Z886 Allergy status to analgesic agent status: Secondary | ICD-10-CM | POA: Diagnosis not present

## 2017-03-25 DIAGNOSIS — C3491 Malignant neoplasm of unspecified part of right bronchus or lung: Secondary | ICD-10-CM | POA: Diagnosis not present

## 2017-03-25 DIAGNOSIS — Z85118 Personal history of other malignant neoplasm of bronchus and lung: Secondary | ICD-10-CM | POA: Diagnosis not present

## 2017-03-25 MED ORDER — HYDROCODONE-HOMATROPINE 5-1.5 MG/5ML PO SYRP: 5.0000 mL | ORAL_SOLUTION | Freq: Four times a day (QID) | ORAL | 0 refills | Status: DC | PRN

## 2017-03-25 NOTE — Progress Notes (Signed)
Radiation Oncology         [phone] ________________________________  Name: Robert Powers MRN: [id number]  Date: 03/25/2017  DOB: October 26, 1955  Re-consultation Visit Note  CC: Maury Dus, MD  Georgina Quint, MD    ICD-10-CM   1. Brain metastases ([place]) C79.31   2. 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   Narrative:  The patient returns today for a re-consultation. He is accompanied by his wife and family. He was getting a routine scan when evidence of brain metastasis  was found.  He has been experiencing SOB and a cough. He was recently put on antibiotics for a sinus infection. 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 Per his family, he has not been acting any differently at home. He denies coughing up blood, back pain, headaches, visual changes, extremity weakness/ numbness, change in appetite and taste.  The trial of steroids did not seem to help the patient with his breathing. He is however able to continue to work full-time. Patient says the cough medicine prescribed was most helpful.          REVIEW OF SYSTEMS: A 10+ POINT REVIEW OF SYSTEMS WAS OBTAINED including neurology, dermatology, psychiatry, cardiac, respiratory, lymph, extremities, GI, GU, musculoskeletal, constitutional, reproductive, HEENT. All pertinent positives are noted in the HPI. All others are negative.                 ALLERGIES:  is allergic to aspirin; doxazosin mesylate; itraconazole; norvasc [amlodipine besylate]; tekturna [aliskiren fumarate]; zestoretic [lisinopril-hydrochlorothiazide]; and benicar [olmesartan medoxomil].  Meds: Current  Outpatient Medications  Medication Sig Dispense Refill  . albuterol (PROAIR HFA) 108 (90 Base) MCG/ACT inhaler Inhale into the lungs.    [name] allopurinol (ZYLOPRIM) 300 MG tablet Take 300 mg by mouth daily.     [name] aspirin EC 81 MG tablet Take 81 mg by mouth daily.    . carvedilol (COREG) 6.25 MG tablet TK 1 T PO BID  2  . desloratadine-pseudoephedrine (CLARINEX-D 24-HOUR) 5-240 MG per 24 hr tablet Take 1 tablet by mouth daily.      [name] Dextromethorphan-Menthol (DELSYM COUGH RELIEF MT) Use as directed in the mouth or throat.    . diltiazem (CARDIZEM CD) 180 MG 24 hr capsule Take 180 mg by mouth daily.     [name] esomeprazole (NEXIUM) 20 MG capsule Take 20 mg by mouth daily at 12 noon.    . fenofibrate (TRICOR) 145 MG tablet Take 145 mg by mouth.    . fluticasone (FLONASE) 50 MCG/ACT nasal spray U 1 SPRAY IEN QPM  5  . guaiFENesin (MUCINEX) 600 MG 12 hr tablet Take by mouth 2 (two) times daily.    . hydrochlorothiazide (HYDRODIURIL) 25 MG tablet Take 25 mg by mouth daily.      [name] HYDROcodone-homatropine (HYCODAN) 5-1.5 MG/5ML syrup Take 5 mLs by mouth every 6 (six) hours as needed for cough. 180 mL 0  . Multiple Vitamin (MULTI-VITAMINS) TABS Take by mouth.    . rosuvastatin (CRESTOR) 10 MG tablet Take 5 mg by mouth daily.      [name]  methylPREDNISolone (MEDROL DOSEPAK) 4 MG TBPK tablet Take 4 mg by mouth.    . predniSONE (DELTASONE) 10 MG tablet Take 6 tabs po daily x 5 days, 5 tabs po x 5 days, 4 tabs po x 5 days, 3 tabs po x 5 days, 2 tabs po x 5 days, 1 tab po x5 days, then dc (Patient not taking: Reported on 03/25/2017) 105 tablet 0  . sucralfate (CARAFATE) 1 g tablet Take 1 tablet (1 g total) by mouth 4 (four) times daily -  with meals and at bedtime. Dissolve in 10-15 cc water prior to swallowing (Patient not taking: Reported on 02/07/2017) 120 tablet 1   No current facility-administered medications for this encounter.     Physical Findings: The patient is in no acute distress. Patient is alert and  oriented.  height is 6\' 1"  (1.854 m) and weight is 228 lb 3.2 oz (103.5 kg). His temperature is 99.1 F (37.3 C). His blood pressure is 173/87 (abnormal) and his pulse is 104 (abnormal). His oxygen saturation is 95%. .  No significant changes.  Lungs are clear to auscultation bilaterally. Heart has regular rate and rhythm. No palpable cervical, supraclavicular, or axillary adenopathy. Abdomen soft, non-tender, normal bowel sounds. Complete neurological examination was performed showing no obvious neurologic deficits.   Lab Findings: Lab Results  Component Value Date   WBC 8.9 06/15/2011   HGB 13.3 06/15/2011   HCT 38.3 (L) 06/15/2011   MCV 101.9 (H) 06/15/2011   PLT 213 06/15/2011    Radiographic Findings: No results found.  Impression:  Limited stage small cell lung cancer status post chemotherapy and chest radiation treatments. Recent MRI at [facility] unfortunately shows multiple small metastasis within the cerebral and cerebellar hemispheres. At least 11 lesions could be seen the largest being in the inferior right cerebellum measuring 1.3 cm.  There was also noted to be a lesion with enhancement in the intramedullary left cervical cord at C1.  Patient does not seem to be having symptomatic from lesions at this time. He would be a good candidate for whole brain radiation therapy. He would not be a candidate for stereotactic radiosurgery given the number of lesions and the fact that he has small cell lung cancer. I discussed course of treatment side effects and potential long-term toxicities of radiation therapy in this situation with the patient and his family. He appears to understand wishes to proceed with planned course of treatment.  Plan:  CT simulation tomorrow 03/26/2017, Radiation treatment to begin February 6. I anticipate 14 treatments directed at the whole brain.  ____________________________________  Blair Promise, PhD, MD  This document serves as a record of services  personally performed by Blair Promise, PhD, MD. It was created on his behalf by Margit Banda, a trained medical scribe. The creation of this record is based on the scribe's personal observations and the provider's statements to them.

## 2017-03-25 NOTE — Progress Notes (Signed)
Location/Histology of Brain Tumor: MRI of brain from 03/22/17 showed:    Patient presented with symptoms of:  He denies symptoms.   Past or anticipated interventions, if any, per neurosurgery: N/A  Past or anticipated interventions, if any, per medical oncology:  Dr. Genene Churn (Thoracic Oncology at [facility])  Dose of Decadron, if applicable: No  Recent neurologic symptoms, if any:   Seizures: No  Headaches: No  Nausea: No  Dizziness/ataxia: No  Difficulty with hand coordination: No  Focal numbness/weakness: No  Visual deficits/changes: No  Confusion/Memory deficits: No  SAFETY ISSUES:  Prior radiation? Yes, Dr. Beola Cord for throat cancer. He also received radiation by Dr. Sondra Come for lung cancer.   Pacemaker/ICD? No  Possible current pregnancy? no  Is the patient on methotrexate? No  Additional Complaints / other details:   BP (!) 173/87   Pulse (!) 104   Temp 99.1 F (37.3 C)   Ht 6\' 1"  (1.854 m)   Wt 228 lb 3.2 oz (103.5 kg)   SpO2 95% Comment: room air  BMI 30.11 kg/m    Wt Readings from Last 3 Encounters:  03/25/17 228 lb 3.2 oz (103.5 kg)  02/28/17 235 lb 6.4 oz (106.8 kg)  02/07/17 238 lb 3.2 oz (108 kg)

## 2017-03-26 ENCOUNTER — Telehealth: Payer: Self-pay | Admitting: Oncology

## 2017-03-26 ENCOUNTER — Emergency Department (HOSPITAL_COMMUNITY)
Admission: EM | Admit: 2017-03-26 | Discharge: 2017-03-26 | Disposition: A | Discharge status: Home / Self Care | Payer: BLUE CROSS/BLUE SHIELD | Attending: Emergency Medicine | Admitting: Emergency Medicine

## 2017-03-26 ENCOUNTER — Emergency Department (HOSPITAL_COMMUNITY): Payer: BLUE CROSS/BLUE SHIELD

## 2017-03-26 ENCOUNTER — Other Ambulatory Visit: Payer: Self-pay

## 2017-03-26 ENCOUNTER — Encounter (HOSPITAL_COMMUNITY): Payer: Self-pay | Admitting: *Deleted

## 2017-03-26 DIAGNOSIS — E876 Hypokalemia: Secondary | ICD-10-CM | POA: Diagnosis not present

## 2017-03-26 DIAGNOSIS — Z923 Personal history of irradiation: Secondary | ICD-10-CM | POA: Diagnosis not present

## 2017-03-26 DIAGNOSIS — R531 Weakness: Secondary | ICD-10-CM | POA: Insufficient documentation

## 2017-03-26 DIAGNOSIS — Z85118 Personal history of other malignant neoplasm of bronchus and lung: Secondary | ICD-10-CM | POA: Diagnosis not present

## 2017-03-26 DIAGNOSIS — Z79899 Other long term (current) drug therapy: Secondary | ICD-10-CM | POA: Insufficient documentation

## 2017-03-26 DIAGNOSIS — Z87891 Personal history of nicotine dependence: Secondary | ICD-10-CM | POA: Insufficient documentation

## 2017-03-26 DIAGNOSIS — I10 Essential (primary) hypertension: Secondary | ICD-10-CM | POA: Diagnosis not present

## 2017-03-26 DIAGNOSIS — Z85818 Personal history of malignant neoplasm of other sites of lip, oral cavity, and pharynx: Secondary | ICD-10-CM | POA: Diagnosis not present

## 2017-03-26 DIAGNOSIS — Z7982 Long term (current) use of aspirin: Secondary | ICD-10-CM | POA: Diagnosis not present

## 2017-03-26 DIAGNOSIS — R0602 Shortness of breath: Secondary | ICD-10-CM | POA: Diagnosis not present

## 2017-03-26 DIAGNOSIS — R202 Paresthesia of skin: Secondary | ICD-10-CM

## 2017-03-26 DIAGNOSIS — R55 Syncope and collapse: Secondary | ICD-10-CM | POA: Insufficient documentation

## 2017-03-26 DIAGNOSIS — R000 Tachycardia, unspecified: Secondary | ICD-10-CM | POA: Insufficient documentation

## 2017-03-26 DIAGNOSIS — R42 Dizziness and giddiness: Secondary | ICD-10-CM | POA: Diagnosis not present

## 2017-03-26 LAB — CBC
HCT: 41.6 % (ref 39.0–52.0)
Hemoglobin: 13.9 g/dL (ref 13.0–17.0)
MCH: 32.3 pg (ref 26.0–34.0)
MCHC: 33.4 g/dL (ref 30.0–36.0)
MCV: 96.5 fL (ref 78.0–100.0)
Platelets: 210 10*3/uL (ref 150–400)
RBC: 4.31 MIL/uL (ref 4.22–5.81)
RDW: 13.9 % (ref 11.5–15.5)
WBC: 8.9 10*3/uL (ref 4.0–10.5)

## 2017-03-26 LAB — BASIC METABOLIC PANEL
Anion gap: 15 (ref 5–15)
BUN: 11 mg/dL (ref 6–20)
CALCIUM: 9.5 mg/dL (ref 8.9–10.3)
CO2: 21 mmol/L — AB (ref 22–32)
CREATININE: 0.93 mg/dL (ref 0.61–1.24)
Chloride: 101 mmol/L (ref 101–111)
GFR calc Af Amer: >60 mL/min (ref >60)
GFR calc non Af Amer: >60 mL/min (ref >60)
GLUCOSE: 93 mg/dL (ref 65–99)
Potassium: 3.2 mmol/L — ABNORMAL LOW (ref 3.5–5.1)
Sodium: 137 mmol/L (ref 135–145)

## 2017-03-26 LAB — I-STAT TROPONIN, ED: TROPONIN I, POC: 0 ng/mL (ref 0.00–0.08)

## 2017-03-26 MED ORDER — SODIUM CHLORIDE 0.9 % IV BOLUS (SEPSIS): 1000.0000 mL | Freq: Once | INTRAVENOUS | Status: DC

## 2017-03-26 MED ORDER — POTASSIUM CHLORIDE CRYS ER 20 MEQ PO TBCR: 20.0000 meq | EXTENDED_RELEASE_TABLET | Freq: Every day | ORAL | 0 refills | Status: AC
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
## 2017-03-26 NOTE — Discharge Instructions (Signed)
It was our pleasure to provide your ER care today - we hope that you feel better.  Rest. Drink adequate fluids.  From today's labs, your potassium level is mildly low (3.2) - eat plenty of fruits and vegetables, take potassium supplement as prescribed, and follow up with your doctor in the next 1-2 weeks.   Also follow up closely with your doctor regarding your blood pressure.   Dr Sondra Come indicates he will contact you for follow up plan today.  Return to ER if worse, new symptoms, fevers, weak/fainting, new or severe pain, increased trouble breathing, other concern.

## 2017-03-26 NOTE — Telephone Encounter (Addendum)
Patient's wife left a message and said patient is being admitted to [facility] with dizziness.  She would like Dr. Sondra Come to give her a call today.  Called her back and she said Rudell was driving and starting feeling dizzy and had sharp pains going up his left arm.  He then went to [facility] ER to be evaluated.  She also said his bp "was through the roof" and that may be causing his issues.  Advised her that we will check with CT SIM about his appointment and if he will be admitted, to try to be transferred to [facility] so that he would not have to travel for radiation.

## 2017-03-26 NOTE — ED Notes (Signed)
Patient refused IV. States if he gets admitted he wants to go to [facility]. MD aware, patient may drink water instead.

## 2017-03-26 NOTE — ED Triage Notes (Addendum)
Pt arrived at ambulance bay with dizziness, numbness to left arm, and sob.  Pt denies chest pain.  Pt is a cancer patient and is to start radiation today.  Pt is alert and ekg done on arrival.  Pt drinks etoh daily and last drink last nite.

## 2017-03-27 ENCOUNTER — Ambulatory Visit
Admission: RE | Admit: 2017-03-27 | Discharge: 2017-03-27 | Disposition: A | Discharge status: Home / Self Care | Payer: BLUE CROSS/BLUE SHIELD | Source: Ambulatory Visit | Attending: Radiation Oncology | Admitting: Radiation Oncology

## 2017-03-27 DIAGNOSIS — C3411 Malignant neoplasm of upper lobe, right bronchus or lung: Secondary | ICD-10-CM | POA: Diagnosis not present

## 2017-03-27 DIAGNOSIS — Z510 Encounter for antineoplastic radiation therapy: Secondary | ICD-10-CM | POA: Diagnosis not present

## 2017-03-27 DIAGNOSIS — C7931 Secondary malignant neoplasm of brain: Secondary | ICD-10-CM | POA: Insufficient documentation

## 2017-03-27 MED ORDER — LORAZEPAM 0.5 MG PO TABS: 0.5000 mg | ORAL_TABLET | Freq: Three times a day (TID) | ORAL | 0 refills | Status: AC

## 2017-03-27 NOTE — Progress Notes (Signed)
  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  Identity was confirmed.  All relevant records and images related to the planned course of therapy were reviewed.  The patient freely provided informed written consent to proceed with treatment after reviewing the details related to the planned course of therapy. The consent form was witnessed and verified by the simulation staff.  Then, the patient was set-up in a stable reproducible  supine position for radiation therapy.  CT images were obtained.  Surface markings were placed.  The CT images were loaded into the planning software.  Then the target and avoidance structures were contoured.  Treatment planning then occurred.  The radiation prescription was entered and confirmed.  Then, I designed and supervised the construction of a total of 3 medically necessary complex treatment devices.  I have requested : Isodose Plan.  I have ordered:dose calc.  PLAN:  The patient will receive 35 Gy in 14 fractions directed at the whole brain as well as the C1 spine area given  involvement in this area on recent MRI.  -----------------------------------  Blair Promise, PhD, MD  This document serves as a record of services personally performed by Gery Pray, MD. It was created on his behalf by Steva Colder, a trained medical scribe. The creation of this record is based on the scribe's personal observations and the provider's statements to them. This document has been checked and approved by the attending provider.

## 2017-03-28 ENCOUNTER — Ambulatory Visit
Admission: RE | Admit: 2017-03-28 | Discharge: 2017-03-28 | Disposition: A | Discharge status: Home / Self Care | Payer: BLUE CROSS/BLUE SHIELD | Source: Ambulatory Visit | Attending: Radiation Oncology | Admitting: Radiation Oncology

## 2017-03-28 DIAGNOSIS — Z510 Encounter for antineoplastic radiation therapy: Secondary | ICD-10-CM | POA: Diagnosis not present

## 2017-03-28 DIAGNOSIS — C7931 Secondary malignant neoplasm of brain: Secondary | ICD-10-CM | POA: Diagnosis not present

## 2017-03-28 DIAGNOSIS — C3411 Malignant neoplasm of upper lobe, right bronchus or lung: Secondary | ICD-10-CM | POA: Diagnosis not present

## 2017-03-28 NOTE — Progress Notes (Signed)
  Radiation Oncology         [phone] ________________________________  Name: Robert Powers MRN: [id number]  Date: 03/28/2017  DOB: [birth year]  Simulation Verification Note    ICD-10-CM   1. Brain metastasis ([place]) C79.31     Status: outpatient  NARRATIVE: The patient was brought to the treatment unit and placed in the planned treatment position. The clinical setup was verified. Then port films were obtained and uploaded to the radiation oncology medical record software.  The treatment beams were carefully compared against the planned radiation fields. The position location and shape of the radiation fields was reviewed. They targeted volume of tissue appears to be appropriately covered by the radiation beams. Organs at risk appear to be excluded as planned.  Based on my personal review, I approved the simulation verification. The patient's treatment will proceed as planned.  -----------------------------------  Blair Promise, PhD, MD

## 2017-03-29 ENCOUNTER — Ambulatory Visit
Admission: RE | Admit: 2017-03-29 | Discharge: 2017-03-29 | Disposition: A | Discharge status: Home / Self Care | Payer: BLUE CROSS/BLUE SHIELD | Source: Ambulatory Visit | Attending: Radiation Oncology | Admitting: Radiation Oncology

## 2017-03-29 DIAGNOSIS — C7931 Secondary malignant neoplasm of brain: Secondary | ICD-10-CM | POA: Diagnosis not present

## 2017-03-29 DIAGNOSIS — Z510 Encounter for antineoplastic radiation therapy: Secondary | ICD-10-CM | POA: Diagnosis not present

## 2017-03-29 DIAGNOSIS — C3411 Malignant neoplasm of upper lobe, right bronchus or lung: Secondary | ICD-10-CM | POA: Diagnosis not present

## 2017-04-01 ENCOUNTER — Other Ambulatory Visit: Payer: Self-pay | Admitting: Radiation Oncology

## 2017-04-01 ENCOUNTER — Ambulatory Visit
Admission: RE | Admit: 2017-04-01 | Discharge: 2017-04-01 | Disposition: A | Discharge status: Home / Self Care | Payer: BLUE CROSS/BLUE SHIELD | Source: Ambulatory Visit | Attending: Radiation Oncology | Admitting: Radiation Oncology

## 2017-04-01 ENCOUNTER — Ambulatory Visit: Payer: Self-pay | Admitting: Radiation Oncology

## 2017-04-01 DIAGNOSIS — Z510 Encounter for antineoplastic radiation therapy: Secondary | ICD-10-CM | POA: Diagnosis not present

## 2017-04-01 DIAGNOSIS — C7931 Secondary malignant neoplasm of brain: Secondary | ICD-10-CM | POA: Diagnosis not present

## 2017-04-01 DIAGNOSIS — C3411 Malignant neoplasm of upper lobe, right bronchus or lung: Secondary | ICD-10-CM | POA: Diagnosis not present

## 2017-04-01 MED ORDER — HYDROCODONE-HOMATROPINE 5-1.5 MG/5ML PO SYRP: 5.0000 mL | ORAL_SOLUTION | Freq: Four times a day (QID) | ORAL | 0 refills | Status: DC | PRN

## 2017-04-02 ENCOUNTER — Ambulatory Visit
Admission: RE | Admit: 2017-04-02 | Discharge: 2017-04-02 | Disposition: A | Discharge status: Home / Self Care | Payer: BLUE CROSS/BLUE SHIELD | Source: Ambulatory Visit | Attending: Radiation Oncology | Admitting: Radiation Oncology

## 2017-04-02 DIAGNOSIS — Z510 Encounter for antineoplastic radiation therapy: Secondary | ICD-10-CM | POA: Diagnosis not present

## 2017-04-02 DIAGNOSIS — C3411 Malignant neoplasm of upper lobe, right bronchus or lung: Secondary | ICD-10-CM | POA: Diagnosis not present

## 2017-04-02 DIAGNOSIS — C7931 Secondary malignant neoplasm of brain: Secondary | ICD-10-CM

## 2017-04-02 MED ORDER — SONAFINE EX EMUL
1.0000 "application " | Freq: Two times a day (BID) | CUTANEOUS | Status: DC
  Administered 2017-04-02: 1 via TOPICAL

## 2017-04-03 ENCOUNTER — Ambulatory Visit
Admission: RE | Admit: 2017-04-03 | Discharge: 2017-04-03 | Disposition: A | Discharge status: Home / Self Care | Payer: BLUE CROSS/BLUE SHIELD | Source: Ambulatory Visit | Attending: Radiation Oncology | Admitting: Radiation Oncology

## 2017-04-03 DIAGNOSIS — C3411 Malignant neoplasm of upper lobe, right bronchus or lung: Secondary | ICD-10-CM | POA: Diagnosis not present

## 2017-04-03 DIAGNOSIS — C7931 Secondary malignant neoplasm of brain: Secondary | ICD-10-CM | POA: Diagnosis not present

## 2017-04-03 DIAGNOSIS — Z510 Encounter for antineoplastic radiation therapy: Secondary | ICD-10-CM | POA: Diagnosis not present

## 2017-04-03 NOTE — Progress Notes (Signed)
Pt here for patient teaching.  Pt given Radiation and You booklet, skin care instructions and Sonafine.  Reviewed areas of pertinence such as fatigue, hair loss, nausea and vomiting, skin changes, headache, blurry vision and earaches . Pt able to give teach back of to pat skin and use unscented/gentle soap,apply Sonafine bid and avoid applying anything to skin within 4 hours of treatment. Pt demonstrated understanding, needs reinforcement, no evidence of learning, refused teaching and of information given and will contact nursing with any questions or concerns.     Http://[email or web address]/treatmentinformation/whattoexpect/index

## 2017-04-04 ENCOUNTER — Ambulatory Visit: Payer: Self-pay | Admitting: Radiation Oncology

## 2017-04-04 ENCOUNTER — Ambulatory Visit
Admission: RE | Admit: 2017-04-04 | Discharge: 2017-04-04 | Disposition: A | Discharge status: Home / Self Care | Payer: BLUE CROSS/BLUE SHIELD | Source: Ambulatory Visit | Attending: Radiation Oncology | Admitting: Radiation Oncology

## 2017-04-04 DIAGNOSIS — Z510 Encounter for antineoplastic radiation therapy: Secondary | ICD-10-CM | POA: Diagnosis not present

## 2017-04-04 DIAGNOSIS — C3411 Malignant neoplasm of upper lobe, right bronchus or lung: Secondary | ICD-10-CM | POA: Diagnosis not present

## 2017-04-04 DIAGNOSIS — C7931 Secondary malignant neoplasm of brain: Secondary | ICD-10-CM | POA: Diagnosis not present

## 2017-04-05 ENCOUNTER — Ambulatory Visit
Admission: RE | Admit: 2017-04-05 | Discharge: 2017-04-05 | Disposition: A | Discharge status: Home / Self Care | Payer: BLUE CROSS/BLUE SHIELD | Source: Ambulatory Visit | Attending: Radiation Oncology | Admitting: Radiation Oncology

## 2017-04-05 DIAGNOSIS — C7931 Secondary malignant neoplasm of brain: Secondary | ICD-10-CM | POA: Diagnosis not present

## 2017-04-05 DIAGNOSIS — Z510 Encounter for antineoplastic radiation therapy: Secondary | ICD-10-CM | POA: Diagnosis not present

## 2017-04-05 DIAGNOSIS — C3411 Malignant neoplasm of upper lobe, right bronchus or lung: Secondary | ICD-10-CM | POA: Diagnosis not present

## 2017-04-08 ENCOUNTER — Ambulatory Visit: Payer: BLUE CROSS/BLUE SHIELD | Admitting: Radiation Oncology

## 2017-04-08 ENCOUNTER — Ambulatory Visit
Admission: RE | Admit: 2017-04-08 | Discharge: 2017-04-08 | Disposition: A | Discharge status: Home / Self Care | Payer: BLUE CROSS/BLUE SHIELD | Source: Ambulatory Visit | Attending: Radiation Oncology | Admitting: Radiation Oncology

## 2017-04-08 DIAGNOSIS — Z510 Encounter for antineoplastic radiation therapy: Secondary | ICD-10-CM | POA: Diagnosis not present

## 2017-04-08 DIAGNOSIS — C7931 Secondary malignant neoplasm of brain: Secondary | ICD-10-CM | POA: Diagnosis not present

## 2017-04-08 DIAGNOSIS — C3411 Malignant neoplasm of upper lobe, right bronchus or lung: Secondary | ICD-10-CM | POA: Diagnosis not present

## 2017-04-09 ENCOUNTER — Ambulatory Visit
Admission: RE | Admit: 2017-04-09 | Discharge: 2017-04-09 | Disposition: A | Discharge status: Home / Self Care | Payer: BLUE CROSS/BLUE SHIELD | Source: Ambulatory Visit | Attending: Radiation Oncology | Admitting: Radiation Oncology

## 2017-04-09 DIAGNOSIS — C3411 Malignant neoplasm of upper lobe, right bronchus or lung: Secondary | ICD-10-CM | POA: Diagnosis not present

## 2017-04-09 DIAGNOSIS — C7931 Secondary malignant neoplasm of brain: Secondary | ICD-10-CM | POA: Diagnosis not present

## 2017-04-09 DIAGNOSIS — Z510 Encounter for antineoplastic radiation therapy: Secondary | ICD-10-CM | POA: Diagnosis not present

## 2017-04-10 ENCOUNTER — Other Ambulatory Visit: Payer: Self-pay | Admitting: Radiation Oncology

## 2017-04-10 ENCOUNTER — Ambulatory Visit
Admission: RE | Admit: 2017-04-10 | Discharge: 2017-04-10 | Disposition: A | Discharge status: Home / Self Care | Payer: BLUE CROSS/BLUE SHIELD | Source: Ambulatory Visit | Attending: Radiation Oncology | Admitting: Radiation Oncology

## 2017-04-10 DIAGNOSIS — C7931 Secondary malignant neoplasm of brain: Secondary | ICD-10-CM | POA: Diagnosis not present

## 2017-04-10 DIAGNOSIS — Z510 Encounter for antineoplastic radiation therapy: Secondary | ICD-10-CM | POA: Diagnosis not present

## 2017-04-10 DIAGNOSIS — C3411 Malignant neoplasm of upper lobe, right bronchus or lung: Secondary | ICD-10-CM | POA: Diagnosis not present

## 2017-04-10 MED ORDER — HYDROCODONE-HOMATROPINE 5-1.5 MG/5ML PO SYRP: 5.0000 mL | ORAL_SOLUTION | Freq: Four times a day (QID) | ORAL | 0 refills | Status: DC | PRN

## 2017-04-11 ENCOUNTER — Ambulatory Visit
Admission: RE | Admit: 2017-04-11 | Discharge: 2017-04-11 | Disposition: A | Discharge status: Home / Self Care | Payer: BLUE CROSS/BLUE SHIELD | Source: Ambulatory Visit | Attending: Radiation Oncology | Admitting: Radiation Oncology

## 2017-04-11 DIAGNOSIS — C3411 Malignant neoplasm of upper lobe, right bronchus or lung: Secondary | ICD-10-CM | POA: Diagnosis not present

## 2017-04-11 DIAGNOSIS — Z510 Encounter for antineoplastic radiation therapy: Secondary | ICD-10-CM | POA: Diagnosis not present

## 2017-04-11 DIAGNOSIS — C7931 Secondary malignant neoplasm of brain: Secondary | ICD-10-CM | POA: Diagnosis not present

## 2017-04-12 ENCOUNTER — Telehealth: Payer: Self-pay | Admitting: Oncology

## 2017-04-12 ENCOUNTER — Ambulatory Visit
Admission: RE | Admit: 2017-04-12 | Discharge: 2017-04-12 | Disposition: A | Discharge status: Home / Self Care | Payer: BLUE CROSS/BLUE SHIELD | Source: Ambulatory Visit | Attending: Radiation Oncology | Admitting: Radiation Oncology

## 2017-04-12 DIAGNOSIS — Z510 Encounter for antineoplastic radiation therapy: Secondary | ICD-10-CM | POA: Diagnosis not present

## 2017-04-12 DIAGNOSIS — C7931 Secondary malignant neoplasm of brain: Secondary | ICD-10-CM | POA: Diagnosis not present

## 2017-04-12 DIAGNOSIS — C3411 Malignant neoplasm of upper lobe, right bronchus or lung: Secondary | ICD-10-CM | POA: Diagnosis not present

## 2017-04-12 MED ORDER — RADIAPLEXRX EX GEL
Freq: Once | CUTANEOUS | Status: AC
  Administered 2017-04-12: 16:00:00 via TOPICAL

## 2017-04-12 NOTE — Telephone Encounter (Signed)
Robert Powers called and said Robert Powers has redness/burning of his skin on his ears/sides of his head.  She is wondering if there are any other creams that he can use besides Sonafine.  Advised her that they can try Radiaplex instead of Sonafine and that he can also use neosporin plus pain which may numb the areas that are burning.  She verbalized agreement.

## 2017-04-15 ENCOUNTER — Ambulatory Visit
Admission: RE | Admit: 2017-04-15 | Discharge: 2017-04-15 | Disposition: A | Discharge status: Home / Self Care | Payer: BLUE CROSS/BLUE SHIELD | Source: Ambulatory Visit | Attending: Radiation Oncology | Admitting: Radiation Oncology

## 2017-04-15 DIAGNOSIS — C7931 Secondary malignant neoplasm of brain: Secondary | ICD-10-CM | POA: Diagnosis not present

## 2017-04-15 DIAGNOSIS — R918 Other nonspecific abnormal finding of lung field: Secondary | ICD-10-CM | POA: Diagnosis not present

## 2017-04-15 DIAGNOSIS — Z510 Encounter for antineoplastic radiation therapy: Secondary | ICD-10-CM | POA: Diagnosis not present

## 2017-04-15 DIAGNOSIS — C3411 Malignant neoplasm of upper lobe, right bronchus or lung: Secondary | ICD-10-CM | POA: Diagnosis not present

## 2017-04-16 ENCOUNTER — Ambulatory Visit
Admission: RE | Admit: 2017-04-16 | Discharge: 2017-04-16 | Disposition: A | Discharge status: Home / Self Care | Payer: BLUE CROSS/BLUE SHIELD | Source: Ambulatory Visit | Attending: Radiation Oncology | Admitting: Radiation Oncology

## 2017-04-16 DIAGNOSIS — Z510 Encounter for antineoplastic radiation therapy: Secondary | ICD-10-CM | POA: Diagnosis not present

## 2017-04-16 DIAGNOSIS — C7931 Secondary malignant neoplasm of brain: Secondary | ICD-10-CM | POA: Diagnosis not present

## 2017-04-16 DIAGNOSIS — C3411 Malignant neoplasm of upper lobe, right bronchus or lung: Secondary | ICD-10-CM | POA: Diagnosis not present

## 2017-04-17 ENCOUNTER — Encounter: Payer: Self-pay | Admitting: Radiation Oncology

## 2017-04-17 NOTE — Progress Notes (Signed)
  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   At the beginning the patient did not experince many side effects, except fatigue and minor hearing changes. However, by the end his symptoms worsened. He also began to expereince dry eye, skin irriation, erythema, peeling and dryness, as well as some minor balancing issues. Dry Desquamation was most significant around the ear areas The patient denied having any fine motor changes or speech changes throughout treatment. he also denied nausea and vomiting and did not wish to take decadron.   Plan: The patient has completed radiation treatment. The patient will return to radiation oncology clinic for routine followup in one month. I advised them to call or return sooner if they have any questions or concerns related to their recovery or treatment.  -----------------------------------  Blair Promise, PhD, MD  This document serves as a record of services personally performed by Blair Promise, PhD, MD. It was created on his behalf by Margit Banda, a trained medical scribe. The creation of this record is based on the scribe's personal observations and the provider's statements to them.

## 2017-04-18 ENCOUNTER — Other Ambulatory Visit: Payer: Self-pay | Admitting: Radiation Oncology

## 2017-04-18 ENCOUNTER — Telehealth: Payer: Self-pay | Admitting: *Deleted

## 2017-04-18 DIAGNOSIS — Z8521 Personal history of malignant neoplasm of larynx: Secondary | ICD-10-CM | POA: Diagnosis not present

## 2017-04-18 DIAGNOSIS — C7931 Secondary malignant neoplasm of brain: Secondary | ICD-10-CM | POA: Diagnosis not present

## 2017-04-18 DIAGNOSIS — C3491 Malignant neoplasm of unspecified part of right bronchus or lung: Secondary | ICD-10-CM | POA: Diagnosis not present

## 2017-04-18 MED ORDER — HYDROCODONE-HOMATROPINE 5-1.5 MG/5ML PO SYRP: 5.0000 mL | ORAL_SOLUTION | Freq: Four times a day (QID) | ORAL | 0 refills | Status: DC | PRN

## 2017-04-18 NOTE — Telephone Encounter (Signed)
Call from pt's wife stating Dr. Sondra Come was going to refill his cough medicine script and it has not been sent to pharmacy. Left message with Enid Derry in [place], she will notify nurse covering today.

## 2017-05-01 ENCOUNTER — Telehealth: Payer: Self-pay | Admitting: Oncology

## 2017-05-01 NOTE — Telephone Encounter (Signed)
Patient's wife called and said he needs a refill of Hycodan.  He has about 2 days left and last had it filled on 04/18/17.

## 2017-05-02 ENCOUNTER — Other Ambulatory Visit: Payer: Self-pay | Admitting: Radiation Oncology

## 2017-05-02 DIAGNOSIS — Z79899 Other long term (current) drug therapy: Secondary | ICD-10-CM | POA: Diagnosis not present

## 2017-05-02 DIAGNOSIS — C7931 Secondary malignant neoplasm of brain: Secondary | ICD-10-CM | POA: Diagnosis not present

## 2017-05-02 DIAGNOSIS — Z9889 Other specified postprocedural states: Secondary | ICD-10-CM | POA: Diagnosis not present

## 2017-05-02 DIAGNOSIS — C3490 Malignant neoplasm of unspecified part of unspecified bronchus or lung: Secondary | ICD-10-CM | POA: Diagnosis not present

## 2017-05-02 MED ORDER — HYDROCODONE-HOMATROPINE 5-1.5 MG/5ML PO SYRP: 5.0000 mL | ORAL_SOLUTION | Freq: Four times a day (QID) | ORAL | 0 refills | Status: AC | PRN

## 2017-05-16 ENCOUNTER — Ambulatory Visit
Admission: RE | Admit: 2017-05-16 | Discharge: 2017-05-16 | Disposition: A | Discharge status: Home / Self Care | Payer: BLUE CROSS/BLUE SHIELD | Source: Ambulatory Visit | Attending: Radiation Oncology | Admitting: Radiation Oncology

## 2017-05-16 ENCOUNTER — Encounter: Payer: Self-pay | Admitting: Radiation Oncology

## 2017-05-16 ENCOUNTER — Other Ambulatory Visit: Payer: Self-pay

## 2017-05-16 VITALS — BP 147/73 | HR 88 | Temp 98.2°F | Resp 18 | Wt 225.5 lb

## 2017-05-16 DIAGNOSIS — K5900 Constipation, unspecified: Secondary | ICD-10-CM | POA: Diagnosis not present

## 2017-05-16 DIAGNOSIS — Z7982 Long term (current) use of aspirin: Secondary | ICD-10-CM | POA: Insufficient documentation

## 2017-05-16 DIAGNOSIS — C7931 Secondary malignant neoplasm of brain: Secondary | ICD-10-CM | POA: Diagnosis not present

## 2017-05-16 DIAGNOSIS — R51 Headache: Secondary | ICD-10-CM | POA: Diagnosis not present

## 2017-05-16 DIAGNOSIS — C3491 Malignant neoplasm of unspecified part of right bronchus or lung: Secondary | ICD-10-CM | POA: Diagnosis not present

## 2017-05-16 DIAGNOSIS — Z79899 Other long term (current) drug therapy: Secondary | ICD-10-CM | POA: Diagnosis not present

## 2017-05-16 DIAGNOSIS — Z923 Personal history of irradiation: Secondary | ICD-10-CM | POA: Insufficient documentation

## 2017-05-16 DIAGNOSIS — R21 Rash and other nonspecific skin eruption: Secondary | ICD-10-CM | POA: Insufficient documentation

## 2017-05-16 NOTE — Progress Notes (Signed)
Radiation Oncology         [phone] ________________________________  Name: Robert Powers MRN: [id number]  Date: 05/16/2017  DOB: 30-Oct-1955  Follow-Up Visit Note  CC: Maury Dus, MD  Georgina Quint, MD    ICD-10-CM   1. Brain metastases [facility]) C79.31 MR Brain W Wo Contrast  2. 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 He is accompanied by his wife. He is doing well overall. He  started with immunotherapy Beryle Flock), which he has tolerated well thus far. He is following up at [facility] at this time his same medical oncologist.  On review of systems, he reports mildly fatigue, intermittent headaches, bad taste in his mouth, constipation, heartburn, indigestion, tooth loss (he notes that his teeth are falling out), rash to right sided face. He notes that it takes awhile longer to remember things and he has to think a little longer to think of names. He is no longer taking steroids. He is taking Hycodan PRN. He denies any other symptoms.                                ALLERGIES:  is allergic to aspirin; doxazosin mesylate; itraconazole; norvasc [amlodipine besylate]; tekturna [aliskiren fumarate]; zestoretic [lisinopril-hydrochlorothiazide]; and benicar [olmesartan medoxomil].  Meds: Current Outpatient Medications  Medication Sig Dispense Refill  . albuterol (PROAIR HFA) 108 (90 Base) MCG/ACT inhaler Inhale into the lungs.    [name] allopurinol (ZYLOPRIM) 300 MG tablet Take 300 mg by mouth daily.     [name] aspirin EC 81 MG tablet Take 81 mg by mouth daily.    [name] desloratadine-pseudoephedrine (CLARINEX-D 24-HOUR) 5-240 MG per 24 hr tablet Take 1 tablet by mouth daily.      [name]  Dextromethorphan-Menthol (DELSYM COUGH RELIEF MT) Use as directed in the mouth or throat.    . diltiazem (CARDIZEM CD) 180 MG 24 hr capsule Take 180 mg by mouth daily.     . fenofibrate (TRICOR) 145 MG tablet Take 145 mg by mouth.    . fluticasone (FLONASE) 50 MCG/ACT nasal spray U 1 SPRAY IEN QPM  5  . guaiFENesin (MUCINEX) 600 MG 12 hr tablet Take by mouth 2 (two) times daily.    . hyaluronate sodium (RADIAPLEXRX) GEL Apply 1 application topically once.    . hydrochlorothiazide (HYDRODIURIL) 25 MG tablet Take 25 mg by mouth daily.      [name] LORazepam (ATIVAN) 0.5 MG tablet Take 1 tablet (0.5 mg total) by mouth every 8 (eight) hours. 30 tablet 0  . metoprolol tartrate (LOPRESSOR) 25 MG tablet Take 25 mg by mouth once.    . Multiple Vitamin (MULTI-VITAMINS) TABS Take by mouth.    . rosuvastatin (CRESTOR) 10 MG tablet Take 5 mg by mouth daily.      . carvedilol (COREG) 6.25 MG tablet TK 1 T PO BID  2  . esomeprazole (NEXIUM) 20 MG capsule Take 20 mg by mouth daily at 12 noon.    [name] HYDROcodone-homatropine (HYCODAN) 5-1.5 MG/5ML syrup Take 5 mLs by mouth every 6 (six) hours as needed for cough. (Patient not taking:  Reported on 05/16/2017) 180 mL 0  . methylPREDNISolone (MEDROL DOSEPAK) 4 MG TBPK tablet Take 4 mg by mouth.    . potassium chloride SA (K-DUR,KLOR-CON) 20 MEQ tablet Take 1 tablet (20 mEq total) by mouth daily. (Patient not taking: Reported on 05/16/2017) 10 tablet 0  . predniSONE (DELTASONE) 10 MG tablet Take 6 tabs po daily x 5 days, 5 tabs po x 5 days, 4 tabs po x 5 days, 3 tabs po x 5 days, 2 tabs po x 5 days, 1 tab po x5 days, then dc (Patient not taking: Reported on 03/25/2017) 105 tablet 0  . sucralfate (CARAFATE) 1 g tablet Take 1 tablet (1 g total) by mouth 4 (four) times daily -  with meals and at bedtime. Dissolve in 10-15 cc water prior to swallowing (Patient not taking: Reported on 02/07/2017) 120 tablet 1   No current facility-administered medications for this encounter.      Physical Findings: The patient is in no acute distress. Patient is alert and oriented.  weight is 225 lb 8 oz (102.3 kg). His oral temperature is 98.2 F (36.8 C). His blood pressure is 147/73 (abnormal) and his pulse is 88. His respiration is 18 and oxygen saturation is 99%.   Lungs are clear to auscultation bilaterally. Heart has regular rate and rhythm. No palpable cervical, supraclavicular, or axillary adenopathy. Abdomen soft, non-tender, normal bowel sounds.  HEENT: Oropharynx is clear. The neurological exam is nonfocal    Lab Findings: Lab Results  Component Value Date   WBC 8.9 03/26/2017   HGB 13.9 03/26/2017   HCT 41.6 03/26/2017   MCV 96.5 03/26/2017   PLT 210 03/26/2017    Radiographic Findings: No results found.  Impression:  Clinically stable s/p whole brain radiation therapy for multiple brain metastasis related to small cell lung cancer. Patient is on [place] through his medical oncologist; we will repeat his brain MRI in approximately 2 weeks.   Plan:  Patient will follow up in Radiation Oncology in 3 months.   ____________________________________ -----------------------------------  Blair Promise, PhD, MD   This document serves as a record of services personally performed by Gery Pray, MD. It was created on his behalf by [facility], a trained medical scribe. The creation of this record is based on the scribe's personal observations and the provider's statements to them. This document has been checked and approved by the attending provider.

## 2017-05-16 NOTE — Progress Notes (Addendum)
Mr. Wedel is here for his follow-up appointment.Denies any pain. States that he has mild fatigue. States that he gets shortness of breath with activity and some time at rest. Denies any cough. States that his appetite is not good due to his taste. Denies any difficulty with swallowing. States that he is taking immunotherapy once every three weeks. Next treatment is 05/22/2017. States that he is taking [place]. States that  He get nauseous sometime when he tries to eat. States that he is having some memory changes. Denies any issues with fine motor skill.States that his gait is unsteady. Denies any visual changes . States that he has some auditory changes.States that he cannot hear  what you are saying sometime. Denies any slurred speech. Patient states that he is not taking Decadron. Patient does not have any thrush on his tongue. States that he get a headache sometime,but they leave quickly Vitals:   05/16/17 1613 05/16/17 1614  BP: (!) 154/78 (!) 147/73  Pulse: 88   Resp: 18   Temp: 98.2 F (36.8 C)   TempSrc: Oral   SpO2: 99%   Weight: 225 lb 8 oz (102.3 kg)    Wt Readings from Last 3 Encounters:  05/16/17 225 lb 8 oz (102.3 kg)  03/26/17 230 lb (104.3 kg)  03/25/17 228 lb 3.2 oz (103.5 kg)

## 2017-05-23 ENCOUNTER — Telehealth: Payer: Self-pay | Admitting: *Deleted

## 2017-05-23 ENCOUNTER — Telehealth: Payer: Self-pay | Admitting: Oncology

## 2017-05-23 DIAGNOSIS — Z886 Allergy status to analgesic agent status: Secondary | ICD-10-CM | POA: Diagnosis not present

## 2017-05-23 DIAGNOSIS — C7931 Secondary malignant neoplasm of brain: Secondary | ICD-10-CM | POA: Diagnosis not present

## 2017-05-23 DIAGNOSIS — Z888 Allergy status to other drugs, medicaments and biological substances status: Secondary | ICD-10-CM | POA: Diagnosis not present

## 2017-05-23 DIAGNOSIS — C3491 Malignant neoplasm of unspecified part of right bronchus or lung: Secondary | ICD-10-CM | POA: Diagnosis not present

## 2017-05-23 DIAGNOSIS — C3490 Malignant neoplasm of unspecified part of unspecified bronchus or lung: Secondary | ICD-10-CM | POA: Diagnosis not present

## 2017-05-23 DIAGNOSIS — Z883 Allergy status to other anti-infective agents status: Secondary | ICD-10-CM | POA: Diagnosis not present

## 2017-05-23 NOTE — Telephone Encounter (Signed)
CALLED PATIENT TO INFORM OF MRI FOR 05-27-17 - ARRIVAL TIME- 3:30 PM @ [facility], NO RESTRICTIONS TO TEST, SPOKE WITH PATIENT AND HE IS AWARE OF THIS TEST.

## 2017-05-23 NOTE — Telephone Encounter (Signed)
Patient's wife called and asked if Terrence will be scheduled for a brain MRI.  Advised her that it has been ordered and we will call back once it is scheduled.  She verbalized understanding.

## 2017-05-27 ENCOUNTER — Ambulatory Visit (HOSPITAL_COMMUNITY)
Admission: RE | Admit: 2017-05-27 | Discharge: 2017-05-27 | Disposition: A | Discharge status: Home / Self Care | Payer: BLUE CROSS/BLUE SHIELD | Source: Ambulatory Visit | Attending: Radiation Oncology | Admitting: Radiation Oncology

## 2017-05-27 DIAGNOSIS — C801 Malignant (primary) neoplasm, unspecified: Secondary | ICD-10-CM | POA: Diagnosis not present

## 2017-05-27 DIAGNOSIS — C7931 Secondary malignant neoplasm of brain: Secondary | ICD-10-CM | POA: Diagnosis not present

## 2017-05-27 DIAGNOSIS — C3490 Malignant neoplasm of unspecified part of unspecified bronchus or lung: Secondary | ICD-10-CM | POA: Diagnosis not present

## 2017-05-27 MED ORDER — GADOBENATE DIMEGLUMINE 529 MG/ML IV SOLN
20.0000 mL | Freq: Once | INTRAVENOUS | Status: AC | PRN
  Administered 2017-05-27: 20 mL via INTRAVENOUS

## 2017-05-28 ENCOUNTER — Telehealth: Payer: Self-pay | Admitting: Oncology

## 2017-05-28 LAB — POCT I-STAT CREATININE: CREATININE: 1.1 mg/dL (ref 0.61–1.24)

## 2017-05-28 NOTE — Telephone Encounter (Signed)
Patient's wife called and said Robert Powers still has a red rash on his forehead, face and back of neck that resembles heat rash.  They have applied cortisone cream to the area and it is not getting better.  Advised her that Dr. Sondra Come will be notified and that we will call back.  Also informed her of [name]'s MRI results from yesterday.  She verbalized agreement.

## 2017-05-29 NOTE — Telephone Encounter (Signed)
Called Robert Powers back and she said Ryden is in and out of the sun during the day but does wear a hat. She said he ran out of the Sonafine cream and is just applying hydrocortisone cream.  Advised her to have him try neosporin plus pain to see if that will help and to call back if it doesn't clear up for an appointment next week.  She verbalized understanding.

## 2017-06-04 DIAGNOSIS — L299 Pruritus, unspecified: Secondary | ICD-10-CM | POA: Diagnosis not present

## 2017-06-04 DIAGNOSIS — I10 Essential (primary) hypertension: Secondary | ICD-10-CM | POA: Diagnosis not present

## 2017-06-05 ENCOUNTER — Telehealth: Payer: Self-pay

## 2017-06-05 NOTE — Telephone Encounter (Signed)
Nutrition Assessment   Reason for Assessment:   Patient identified on Malnutrition Screening report for poor appetite and weight loss  ASSESSMENT:  62 year old male with lung cancer and brain mets.  Patient currently receiving Beryle Flock and has completed radiation therapy.   Spoke with patient via phone and introduced self and service.  Patient reports breads, pastas and meats taste bad, although it is getting better.  Reports he mainly eats egg and banana for breakfast, soup for lunch or bowl of beans and for supper rice with vegetables (pepper steak without the meat).  Reports that he has tried ensure/boost shakes and he likes them but they made him gain too much weight.  Reports his wife was preparing shakes for him and caused him to gain weight in Jan, Feb of this year.    Nutrition Focused Physical Exam: deferred  Medications: reviewed  Labs: reviewed  Anthropometrics:   Height: 72 inches Weight: 225 lb UBW: 225 lb per patient BMI: 30   NUTRITION DIAGNOSIS: Inadequate oral intake related to taste changes as evidenced by 2% weight loss in the last month and decreased intake   INTERVENTION:   Discussed good sources of protein if unable to eat meat.  Patient agreeable to receiving taste change fact sheet via email.   Discussed oral nutrition supplements as added source of calories and protein.   Contact information provided. Patient to reach out to RD if further questions or concerns regarding nutrition    MONITORING, EVALUATION, GOAL: weight trends, intake   NEXT VISIT: as needed, pt to contact  [place]. Zenia Resides, [place], [place] Registered Dietitian [phone] (pager)

## 2017-06-07 DIAGNOSIS — C3490 Malignant neoplasm of unspecified part of unspecified bronchus or lung: Secondary | ICD-10-CM | POA: Diagnosis not present

## 2017-06-07 DIAGNOSIS — I700 Atherosclerosis of aorta: Secondary | ICD-10-CM | POA: Diagnosis not present

## 2017-06-07 DIAGNOSIS — C3491 Malignant neoplasm of unspecified part of right bronchus or lung: Secondary | ICD-10-CM | POA: Diagnosis not present

## 2017-06-07 DIAGNOSIS — C3411 Malignant neoplasm of upper lobe, right bronchus or lung: Secondary | ICD-10-CM | POA: Diagnosis not present

## 2017-06-13 DIAGNOSIS — C7931 Secondary malignant neoplasm of brain: Secondary | ICD-10-CM | POA: Diagnosis not present

## 2017-06-13 DIAGNOSIS — C3490 Malignant neoplasm of unspecified part of unspecified bronchus or lung: Secondary | ICD-10-CM | POA: Diagnosis not present

## 2017-06-13 DIAGNOSIS — Z79899 Other long term (current) drug therapy: Secondary | ICD-10-CM | POA: Diagnosis not present

## 2017-06-13 DIAGNOSIS — C3411 Malignant neoplasm of upper lobe, right bronchus or lung: Secondary | ICD-10-CM | POA: Diagnosis not present

## 2017-06-13 DIAGNOSIS — Z9889 Other specified postprocedural states: Secondary | ICD-10-CM | POA: Diagnosis not present

## 2017-07-04 DIAGNOSIS — Z9889 Other specified postprocedural states: Secondary | ICD-10-CM | POA: Diagnosis not present

## 2017-07-04 DIAGNOSIS — C7931 Secondary malignant neoplasm of brain: Secondary | ICD-10-CM | POA: Diagnosis not present

## 2017-07-04 DIAGNOSIS — Z79899 Other long term (current) drug therapy: Secondary | ICD-10-CM | POA: Diagnosis not present

## 2017-07-04 DIAGNOSIS — C3411 Malignant neoplasm of upper lobe, right bronchus or lung: Secondary | ICD-10-CM | POA: Diagnosis not present

## 2017-07-04 DIAGNOSIS — C3490 Malignant neoplasm of unspecified part of unspecified bronchus or lung: Secondary | ICD-10-CM | POA: Diagnosis not present

## 2017-07-25 DIAGNOSIS — Z79899 Other long term (current) drug therapy: Secondary | ICD-10-CM | POA: Diagnosis not present

## 2017-07-25 DIAGNOSIS — Z9889 Other specified postprocedural states: Secondary | ICD-10-CM | POA: Diagnosis not present

## 2017-07-25 DIAGNOSIS — C3490 Malignant neoplasm of unspecified part of unspecified bronchus or lung: Secondary | ICD-10-CM | POA: Diagnosis not present

## 2017-07-25 DIAGNOSIS — C7931 Secondary malignant neoplasm of brain: Secondary | ICD-10-CM | POA: Diagnosis not present

## 2017-08-06 DIAGNOSIS — Z5111 Encounter for antineoplastic chemotherapy: Secondary | ICD-10-CM | POA: Diagnosis not present

## 2017-08-06 DIAGNOSIS — C3490 Malignant neoplasm of unspecified part of unspecified bronchus or lung: Secondary | ICD-10-CM | POA: Diagnosis not present

## 2017-08-06 DIAGNOSIS — J701 Chronic and other pulmonary manifestations due to radiation: Secondary | ICD-10-CM | POA: Diagnosis not present

## 2017-08-06 DIAGNOSIS — I700 Atherosclerosis of aorta: Secondary | ICD-10-CM | POA: Diagnosis not present

## 2017-08-06 DIAGNOSIS — I358 Other nonrheumatic aortic valve disorders: Secondary | ICD-10-CM | POA: Diagnosis not present

## 2017-08-06 DIAGNOSIS — I2510 Atherosclerotic heart disease of native coronary artery without angina pectoris: Secondary | ICD-10-CM | POA: Diagnosis not present

## 2017-08-15 ENCOUNTER — Ambulatory Visit: Payer: Self-pay | Admitting: Radiation Oncology

## 2017-08-15 DIAGNOSIS — C3490 Malignant neoplasm of unspecified part of unspecified bronchus or lung: Secondary | ICD-10-CM | POA: Diagnosis not present

## 2017-08-15 DIAGNOSIS — C7931 Secondary malignant neoplasm of brain: Secondary | ICD-10-CM | POA: Diagnosis not present

## 2017-08-19 ENCOUNTER — Ambulatory Visit
Admission: RE | Admit: 2017-08-19 | Discharge: 2017-08-19 | Disposition: A | Discharge status: Home / Self Care | Payer: BLUE CROSS/BLUE SHIELD | Source: Ambulatory Visit | Attending: Radiation Oncology | Admitting: Radiation Oncology

## 2017-08-19 ENCOUNTER — Other Ambulatory Visit: Payer: Self-pay

## 2017-08-19 ENCOUNTER — Encounter: Payer: Self-pay | Admitting: Radiation Oncology

## 2017-08-19 VITALS — BP 129/76 | HR 86 | Temp 98.6°F | Resp 18 | Wt 209.4 lb

## 2017-08-19 DIAGNOSIS — C7931 Secondary malignant neoplasm of brain: Secondary | ICD-10-CM | POA: Diagnosis not present

## 2017-08-19 DIAGNOSIS — C3411 Malignant neoplasm of upper lobe, right bronchus or lung: Secondary | ICD-10-CM

## 2017-08-19 DIAGNOSIS — Z08 Encounter for follow-up examination after completed treatment for malignant neoplasm: Secondary | ICD-10-CM | POA: Diagnosis not present

## 2017-08-19 DIAGNOSIS — C3491 Malignant neoplasm of unspecified part of right bronchus or lung: Secondary | ICD-10-CM | POA: Diagnosis not present

## 2017-08-19 DIAGNOSIS — H9190 Unspecified hearing loss, unspecified ear: Secondary | ICD-10-CM | POA: Diagnosis not present

## 2017-08-19 NOTE — Progress Notes (Signed)
Pt is here for follow up with Dr. Sondra Come. Pt denies pain. Pt denies fatigue. Pt denies vision changes but states hearing is worse since radiation. Pt reports skin is itching because hair is growing. Pt denies nausea/vomiting. Pt's wife reports rare memory concerns. Pt denies any fine motor issues. Pt accompanied by wife.   BP 129/76   Pulse 86   Temp 98.6 F (37 C)   Resp 18   Wt 209 lb 6.4 oz (95 kg)   SpO2 99%   BMI 28.40 kg/m   Wt Readings from Last 3 Encounters:  08/19/17 209 lb 6.4 oz (95 kg)  05/16/17 225 lb 8 oz (102.3 kg)  03/26/17 230 lb (104.3 kg)

## 2017-08-19 NOTE — Progress Notes (Signed)
Radiation Oncology         [phone] ________________________________  Name: Robert Powers MRN: [id number]  Date: 08/19/2017  DOB: December 03, 1955  Follow-Up Visit Note  CC: Maury Dus, MD  Georgina Quint, MD    ICD-10-CM   1. Small cell lung cancer, right (HCC) C34.91   2. Small cell lung cancer, right upper lobe (HCC) C34.11   3. 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 He is accompanied by his wife. He is doing well overall. He is follow up with his Medical Oncologist and notes that his Medical Oncologist will be moving to [place]. He is still getting his immunotherapy, Keytruda q 3 weeks. Recent labwork completed on 08/15/2017 with his Medical Oncologist.   Since his last visit to the office, he underwent MR Brain on 05/27/2017 with results showing: Complete or near complete response to treatment. No residual T2 or FLAIR signal at the site of multiple previous intracranial metastases. 4 tiny foci enhancement at the site of previous metastatic lesions as discussed above. Many of the other treated metastatic lesions show no residua whatsoever.  On review of systems, he reports mild hearing changes s/p radiation and mild memory issues. He hasn't followed up with his ENT since radiation treatment, although he does have an ENT Specialist, Dr. Lucia Gaskins. He has to have his ears routinely cleaned out due to cerumen impactions. He is still able to work and he worked this morning with no difficulty or issues. he denies HA, visional change, and any other symptoms. Pertinent positives are listed and detailed within the above HPI.                               ALLERGIES:  is allergic  to aspirin; doxazosin mesylate; itraconazole; norvasc [amlodipine besylate]; tekturna [aliskiren fumarate]; zestoretic [lisinopril-hydrochlorothiazide]; and benicar [olmesartan medoxomil].  Meds: Current Outpatient Medications  Medication Sig Dispense Refill  . albuterol (PROAIR HFA) 108 (90 Base) MCG/ACT inhaler Inhale into the lungs.    [name] allopurinol (ZYLOPRIM) 300 MG tablet Take 300 mg by mouth daily.     [name] aspirin EC 81 MG tablet Take 81 mg by mouth daily.    . carvedilol (COREG) 6.25 MG tablet TK 1 T PO BID  2  . desloratadine-pseudoephedrine (CLARINEX-D 24-HOUR) 5-240 MG per 24 hr tablet Take 1 tablet by mouth daily.      [name] Dextromethorphan-Menthol (DELSYM COUGH RELIEF MT) Use as directed in the mouth or throat.    . esomeprazole (NEXIUM) 20 MG capsule Take 20 mg by mouth daily at 12 noon.    . fenofibrate (TRICOR) 145 MG tablet Take 145 mg by mouth.    . fluticasone (FLONASE) 50 MCG/ACT nasal spray U 1 SPRAY IEN QPM  5  . guaiFENesin (MUCINEX) 600 MG 12 hr tablet Take by mouth 2 (two) times daily.    . hydrochlorothiazide (HYDRODIURIL) 25 MG tablet Take 25 mg by mouth daily.      [name] LORazepam (ATIVAN) 0.5 MG tablet Take 1 tablet (0.5 mg total) by mouth every 8 (eight) hours. 30 tablet 0  . metoprolol tartrate (  LOPRESSOR) 25 MG tablet Take 150 mg by mouth once.     . Multiple Vitamin (MULTI-VITAMINS) TABS Take by mouth.    . rosuvastatin (CRESTOR) 10 MG tablet Take 5 mg by mouth daily.      [name] diltiazem (CARDIZEM CD) 180 MG 24 hr capsule Take 180 mg by mouth daily.     . hyaluronate sodium (RADIAPLEXRX) GEL Apply 1 application topically once.    [name] HYDROcodone-homatropine (HYCODAN) 5-1.5 MG/5ML syrup Take 5 mLs by mouth every 6 (six) hours as needed for cough. (Patient not taking: Reported on 05/16/2017) 180 mL 0  . methylPREDNISolone (MEDROL DOSEPAK) 4 MG TBPK tablet Take 4 mg by mouth.    . potassium chloride SA (K-DUR,KLOR-CON) 20 MEQ tablet Take 1 tablet (20 mEq total) by mouth daily.  (Patient not taking: Reported on 05/16/2017) 10 tablet 0  . predniSONE (DELTASONE) 10 MG tablet Take 6 tabs po daily x 5 days, 5 tabs po x 5 days, 4 tabs po x 5 days, 3 tabs po x 5 days, 2 tabs po x 5 days, 1 tab po x5 days, then dc (Patient not taking: Reported on 03/25/2017) 105 tablet 0  . sucralfate (CARAFATE) 1 g tablet Take 1 tablet (1 g total) by mouth 4 (four) times daily -  with meals and at bedtime. Dissolve in 10-15 cc water prior to swallowing (Patient not taking: Reported on 02/07/2017) 120 tablet 1   No current facility-administered medications for this encounter.     Physical Findings: The patient is in no acute distress. Patient is alert and oriented.  weight is 209 lb 6.4 oz (95 kg). His temperature is 98.6 F (37 C). His blood pressure is 129/76 and his pulse is 86. His respiration is 18 and oxygen saturation is 99%.   Lungs are clear to auscultation bilaterally. Heart has regular rate and rhythm. No palpable cervical, supraclavicular, or axillary adenopathy. Abdomen soft, non-tender, normal bowel sounds.  HEENT: Oropharynx is clear. Cerumen noted to bilateral TM's with mild fluid behind both ear drums.  The neurological exam is nonfocal.   Lab Findings: Lab Results  Component Value Date   WBC 8.9 03/26/2017   HGB 13.9 03/26/2017   HCT 41.6 03/26/2017   MCV 96.5 03/26/2017   PLT 210 03/26/2017    Radiographic Findings: No results found.  Impression:  Clinically stable. Patient reports recent chest CT scan with no evidence of recurrence. Pt has noted some mild memory problems since his rad therapy, but is still working full time. Pt has noticed as well as his wife, decreased hearing. The patient will follow up with ENT, Dr. Radene Journey regarding this issue.    Plan:  Patient will be scheduled for a brain MRI and routine follow up in Radiation Oncology in 3 months.  ____________________________________ -----------------------------------  Blair Promise, PhD,  MD   This document serves as a record of services personally performed by Gery Pray, MD. It was created on his behalf by [facility], a trained medical scribe. The creation of this record is based on the scribe's personal observations and the provider's statements to them. This document has been checked and approved by the attending provider.

## 2017-08-26 DIAGNOSIS — I10 Essential (primary) hypertension: Secondary | ICD-10-CM | POA: Diagnosis not present

## 2017-08-26 DIAGNOSIS — E782 Mixed hyperlipidemia: Secondary | ICD-10-CM | POA: Diagnosis not present

## 2017-08-26 DIAGNOSIS — C3411 Malignant neoplasm of upper lobe, right bronchus or lung: Secondary | ICD-10-CM | POA: Diagnosis not present

## 2017-09-09 ENCOUNTER — Telehealth: Payer: Self-pay | Admitting: *Deleted

## 2017-09-09 DIAGNOSIS — Z79899 Other long term (current) drug therapy: Secondary | ICD-10-CM | POA: Diagnosis not present

## 2017-09-09 DIAGNOSIS — Z923 Personal history of irradiation: Secondary | ICD-10-CM | POA: Diagnosis not present

## 2017-09-09 DIAGNOSIS — C3491 Malignant neoplasm of unspecified part of right bronchus or lung: Secondary | ICD-10-CM | POA: Diagnosis not present

## 2017-09-09 DIAGNOSIS — C7931 Secondary malignant neoplasm of brain: Secondary | ICD-10-CM | POA: Diagnosis not present

## 2017-09-09 DIAGNOSIS — C3490 Malignant neoplasm of unspecified part of unspecified bronchus or lung: Secondary | ICD-10-CM | POA: Diagnosis not present

## 2017-09-09 NOTE — Telephone Encounter (Signed)
CALLED PATIENT TO INFORM OF MRI FOR 09/12/17 - ARRIVAL TIME - 3:30 PM , NO RESTRICTIONS TO TEST, TEST TO BE @ 4 PM @ [facility], SPOKE WITH PATIENT'S WIFE DEBRA AND SHE IS AWARE OF THIS TEST

## 2017-09-12 ENCOUNTER — Ambulatory Visit (HOSPITAL_COMMUNITY)
Admission: RE | Admit: 2017-09-12 | Discharge: 2017-09-12 | Disposition: A | Discharge status: Home / Self Care | Payer: BLUE CROSS/BLUE SHIELD | Source: Ambulatory Visit | Attending: Radiation Oncology | Admitting: Radiation Oncology

## 2017-09-12 DIAGNOSIS — H748X3 Other specified disorders of middle ear and mastoid, bilateral: Secondary | ICD-10-CM | POA: Diagnosis not present

## 2017-09-12 DIAGNOSIS — C7931 Secondary malignant neoplasm of brain: Secondary | ICD-10-CM | POA: Diagnosis not present

## 2017-09-12 DIAGNOSIS — C3490 Malignant neoplasm of unspecified part of unspecified bronchus or lung: Secondary | ICD-10-CM | POA: Diagnosis not present

## 2017-09-12 MED ORDER — GADOBENATE DIMEGLUMINE 529 MG/ML IV SOLN
20.0000 mL | Freq: Once | INTRAVENOUS | Status: AC | PRN
  Administered 2017-09-12: 20 mL via INTRAVENOUS

## 2017-09-13 ENCOUNTER — Telehealth: Payer: Self-pay

## 2017-09-13 NOTE — Telephone Encounter (Signed)
Gery Pray, MD  Loma Sousa, RN        Sharee Pimple, please inform patient of good results on recent brain MRI. Thanks, jk    Left VM on identified VM of above message with this RN's direct number for any further questions/concerns. Loma Sousa, RN BSN

## 2017-10-07 DIAGNOSIS — C7931 Secondary malignant neoplasm of brain: Secondary | ICD-10-CM | POA: Diagnosis not present

## 2017-10-07 DIAGNOSIS — Z79899 Other long term (current) drug therapy: Secondary | ICD-10-CM | POA: Diagnosis not present

## 2017-10-07 DIAGNOSIS — Z8521 Personal history of malignant neoplasm of larynx: Secondary | ICD-10-CM | POA: Diagnosis not present

## 2017-10-07 DIAGNOSIS — C3491 Malignant neoplasm of unspecified part of right bronchus or lung: Secondary | ICD-10-CM | POA: Diagnosis not present

## 2017-10-28 DIAGNOSIS — Z5112 Encounter for antineoplastic immunotherapy: Secondary | ICD-10-CM | POA: Diagnosis not present

## 2017-10-28 DIAGNOSIS — C3491 Malignant neoplasm of unspecified part of right bronchus or lung: Secondary | ICD-10-CM | POA: Diagnosis not present

## 2017-11-18 DIAGNOSIS — C7931 Secondary malignant neoplasm of brain: Secondary | ICD-10-CM | POA: Diagnosis not present

## 2017-11-18 DIAGNOSIS — R911 Solitary pulmonary nodule: Secondary | ICD-10-CM | POA: Diagnosis not present

## 2017-11-18 DIAGNOSIS — C3490 Malignant neoplasm of unspecified part of unspecified bronchus or lung: Secondary | ICD-10-CM | POA: Diagnosis not present

## 2017-11-18 DIAGNOSIS — Z85110 Personal history of malignant carcinoid tumor of bronchus and lung: Secondary | ICD-10-CM | POA: Diagnosis not present

## 2017-11-21 ENCOUNTER — Ambulatory Visit
Admission: RE | Admit: 2017-11-21 | Discharge: 2017-11-21 | Disposition: A | Discharge status: Home / Self Care | Payer: BLUE CROSS/BLUE SHIELD | Source: Ambulatory Visit | Attending: Radiation Oncology | Admitting: Radiation Oncology

## 2017-11-21 ENCOUNTER — Other Ambulatory Visit: Payer: Self-pay

## 2017-11-21 ENCOUNTER — Encounter: Payer: Self-pay | Admitting: Radiation Oncology

## 2017-11-21 VITALS — BP 123/83 | HR 80 | Temp 98.1°F | Resp 18 | Ht 72.0 in | Wt 206.4 lb

## 2017-11-21 DIAGNOSIS — Z883 Allergy status to other anti-infective agents status: Secondary | ICD-10-CM | POA: Diagnosis not present

## 2017-11-21 DIAGNOSIS — Z79899 Other long term (current) drug therapy: Secondary | ICD-10-CM | POA: Insufficient documentation

## 2017-11-21 DIAGNOSIS — Z85118 Personal history of other malignant neoplasm of bronchus and lung: Secondary | ICD-10-CM | POA: Diagnosis not present

## 2017-11-21 DIAGNOSIS — C7972 Secondary malignant neoplasm of left adrenal gland: Secondary | ICD-10-CM | POA: Insufficient documentation

## 2017-11-21 DIAGNOSIS — Z08 Encounter for follow-up examination after completed treatment for malignant neoplasm: Secondary | ICD-10-CM | POA: Diagnosis not present

## 2017-11-21 DIAGNOSIS — C7931 Secondary malignant neoplasm of brain: Secondary | ICD-10-CM | POA: Insufficient documentation

## 2017-11-21 DIAGNOSIS — C3411 Malignant neoplasm of upper lobe, right bronchus or lung: Secondary | ICD-10-CM

## 2017-11-21 DIAGNOSIS — E279 Disorder of adrenal gland, unspecified: Secondary | ICD-10-CM | POA: Diagnosis not present

## 2017-11-21 DIAGNOSIS — Z7982 Long term (current) use of aspirin: Secondary | ICD-10-CM | POA: Insufficient documentation

## 2017-11-21 DIAGNOSIS — Z886 Allergy status to analgesic agent status: Secondary | ICD-10-CM | POA: Insufficient documentation

## 2017-11-21 DIAGNOSIS — C3491 Malignant neoplasm of unspecified part of right bronchus or lung: Secondary | ICD-10-CM | POA: Insufficient documentation

## 2017-11-21 NOTE — Progress Notes (Signed)
Pt here for a follow-up today for brain and right lung cancer. Pt states occasional headaches that are sharp. Pt states neck and hands pain. Pt states fatigue. Pt denies vision changes. Pt states hearing changes. Pt states the volume is low. Pt states balance is ok but is not what it used to be. Pt denies having any nausea or vomiting. Pt states that the thought process is a little more difficult and trying to get things into words. Pt states short term memory loss. Pt not taking any steroids. Pt states dry cough. Pt denies having hemoptysis. Pt states moderate shortness of breath on exertion. Pt denies having painful or difficulty swallowing. Pt states that he is on Immunotherapy. Pt states that skin on head area is really itchy.  BP 123/83 (BP Location: Left Arm, Patient Position: Sitting)   Pulse 80   Temp 98.1 F (36.7 C) (Oral)   Resp 18   Ht 6' (1.829 m)   Wt 206 lb 6 oz (93.6 kg)   SpO2 99%   BMI 27.99 kg/m   Wt Readings from Last 3 Encounters:  11/21/17 206 lb 6 oz (93.6 kg)  08/19/17 209 lb 6.4 oz (95 kg)  05/16/17 225 lb 8 oz (102.3 kg)

## 2017-11-21 NOTE — Progress Notes (Signed)
Radiation Oncology         [phone] ________________________________  Name: Robert Powers MRN: [id number]  Date: 11/21/2017  DOB: 25-Aug-1955  Follow-Up Visit Note  CC: Maury Dus, MD  Georgina Quint, MD    ICD-10-CM   1. Small cell lung cancer, right (HCC) C34.91   2. Brain metastasis ([place]) C79.31   3. 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 He is accompanied by his wife. He is doing well overall. He reports occasional sharp headaches, neck and bilateral hand pain, fatigue, hearing changes (lower volume), balance is okay but not how it used to be, short-term memory loss, and trouble with his thought process and putting thoughts into words. He still receives immunotherapy every 3 weeks.  Since his last visit to the office, he underwent brain MRI on 09/12/17 with results showing resolved brain metastasis, and no residual tumor identified.  He also underwent chest/abdomen CT scan on 11/18/17 which revealed: 1. New 1.4 cm left adrenal nodule concerning for metastatic disease in this patient with history of metastatic small cell carcinoma. 2. Slightly increased pleural thickening and dependent pleural fluid on the right is nonspecific but may be related to evolving radiotherapy changes. No new discrete pleural nodularity.  He still works full time pouring concrete. He states he has not gone to the ENT to check on his hearing.                               ALLERGIES:  is allergic to aspirin; doxazosin mesylate; itraconazole; norvasc [amlodipine besylate]; tekturna [aliskiren fumarate]; zestoretic [lisinopril-hydrochlorothiazide]; and benicar [olmesartan medoxomil].  Meds: Current Outpatient Medications    Medication Sig Dispense Refill  . albuterol (PROAIR HFA) 108 (90 Base) MCG/ACT inhaler Inhale into the lungs.    [name] allopurinol (ZYLOPRIM) 300 MG tablet Take 300 mg by mouth daily.     [name] aspirin EC 81 MG tablet Take 81 mg by mouth daily.    . carvedilol (COREG) 6.25 MG tablet TK 1 T PO BID  2  . desloratadine-pseudoephedrine (CLARINEX-D 24-HOUR) 5-240 MG per 24 hr tablet Take 1 tablet by mouth daily.      [name] Dextromethorphan-Menthol (DELSYM COUGH RELIEF MT) Use as directed in the mouth or throat.    . diltiazem (CARDIZEM CD) 180 MG 24 hr capsule Take 180 mg by mouth daily.     [name] esomeprazole (NEXIUM) 20 MG capsule Take 20 mg by mouth daily at 12 noon.    . fenofibrate (TRICOR) 145 MG tablet Take 145 mg by mouth.    . fluticasone (FLONASE) 50 MCG/ACT nasal spray U 1 SPRAY IEN QPM  5  . guaiFENesin (MUCINEX) 600 MG 12 hr tablet Take by mouth 2 (two) times daily.    . hydrochlorothiazide (HYDRODIURIL) 25 MG tablet Take 25 mg by mouth daily.      [name] HYDROcodone-homatropine (HYCODAN) 5-1.5 MG/5ML syrup Take 5 mLs by mouth every 6 (six) hours as needed for cough. 180 mL 0  . LORazepam (ATIVAN) 0.5 MG tablet Take 1 tablet (0.5 mg total) by mouth every 8 (eight) hours. 30 tablet 0  . methylPREDNISolone (MEDROL DOSEPAK) 4 MG  TBPK tablet Take 4 mg by mouth.    . metoprolol tartrate (LOPRESSOR) 25 MG tablet Take 150 mg by mouth once.     . Multiple Vitamin (MULTI-VITAMINS) TABS Take by mouth.    . potassium chloride SA (K-DUR,KLOR-CON) 20 MEQ tablet Take 1 tablet (20 mEq total) by mouth daily. 10 tablet 0  . rosuvastatin (CRESTOR) 10 MG tablet Take 5 mg by mouth daily.       No current facility-administered medications for this encounter.    Review of Systems:  A 10+ POINT REVIEW OF SYSTEMS WAS OBTAINED including neurology, dermatology, psychiatry, cardiac, respiratory, lymph, extremities, GI, GU, musculoskeletal, constitutional, reproductive, HEENT. All pertinent positives are noted in the HPI. Patient  also reports dry cough, moderate shortness of breath on exertion, and an itchy scalp. He denied hemoptysis, nausea or vomiting, and any vision changes.  Physical Findings: The patient is in no acute distress. Patient is alert and oriented.  height is 6' (1.829 m) and weight is 206 lb 6 oz (93.6 kg). His oral temperature is 98.1 F (36.7 C). His blood pressure is 123/83 and his pulse is 80. His respiration is 18 and oxygen saturation is 99%.  Lungs are clear to auscultation bilaterally. Heart has regular rate and rhythm. No palpable cervical, supraclavicular, or axillary adenopathy. Abdomen soft, non-tender, normal bowel sounds. Neurological exam is non-focal.   Lab Findings: Lab Results  Component Value Date   WBC 8.9 03/26/2017   HGB 13.9 03/26/2017   HCT 41.6 03/26/2017   MCV 96.5 03/26/2017   PLT 210 03/26/2017    Radiographic Findings: No results found.  Impression:  Limited stage small cell carcinoma of right lung, recent diagnosis of brain metastasis, status post whole brain radiation therapy.MRI posttreatment showed no residual brain lesions. Clinically stable. Recent CT scans of the body show a new 1.4 cm nodule in the left adrenal gland. In light of this, patient is scheduled for PET scan later this month. I will schedule an MRI of the brain as part of his follow up, too.  Plan:  MRI of the brain and follow up with radiation oncology in 3 months.   -----------------------------------  Blair Promise, PhD, MD  This document serves as a record of services personally performed by Gery Pray, MD. It was created on his behalf by Wilburn Mylar, a trained medical scribe. The creation of this record is based on the scribe's personal observations and the provider's statements to them. This document has been checked and approved by the attending provider.

## 2017-12-09 ENCOUNTER — Telehealth: Payer: Self-pay | Admitting: *Deleted

## 2017-12-09 DIAGNOSIS — C7931 Secondary malignant neoplasm of brain: Secondary | ICD-10-CM | POA: Diagnosis not present

## 2017-12-09 DIAGNOSIS — C3491 Malignant neoplasm of unspecified part of right bronchus or lung: Secondary | ICD-10-CM | POA: Diagnosis not present

## 2017-12-09 DIAGNOSIS — E278 Other specified disorders of adrenal gland: Secondary | ICD-10-CM | POA: Diagnosis not present

## 2017-12-09 DIAGNOSIS — Z5111 Encounter for antineoplastic chemotherapy: Secondary | ICD-10-CM | POA: Diagnosis not present

## 2017-12-09 DIAGNOSIS — C7971 Secondary malignant neoplasm of right adrenal gland: Secondary | ICD-10-CM | POA: Diagnosis not present

## 2017-12-09 DIAGNOSIS — C3411 Malignant neoplasm of upper lobe, right bronchus or lung: Secondary | ICD-10-CM | POA: Diagnosis not present

## 2017-12-09 DIAGNOSIS — C7972 Secondary malignant neoplasm of left adrenal gland: Secondary | ICD-10-CM | POA: Diagnosis not present

## 2017-12-09 NOTE — Telephone Encounter (Signed)
Called patient to inform of MRI for 12-12-17 - arrival time - 2:30 pm @ [facility], no restrictions to test, spoke with patient's wife- Hilda Blades and she is aware of this test.

## 2017-12-12 ENCOUNTER — Ambulatory Visit (HOSPITAL_COMMUNITY)
Admission: RE | Admit: 2017-12-12 | Discharge: 2017-12-12 | Disposition: A | Discharge status: Home / Self Care | Payer: BLUE CROSS/BLUE SHIELD | Source: Ambulatory Visit | Attending: Radiation Oncology | Admitting: Radiation Oncology

## 2017-12-12 DIAGNOSIS — C7931 Secondary malignant neoplasm of brain: Secondary | ICD-10-CM

## 2017-12-12 DIAGNOSIS — C3490 Malignant neoplasm of unspecified part of unspecified bronchus or lung: Secondary | ICD-10-CM | POA: Diagnosis not present

## 2017-12-12 LAB — POCT I-STAT CREATININE: Creatinine, Ser: 1.2 mg/dL (ref 0.61–1.24)

## 2017-12-12 MED ORDER — GADOBUTROL 1 MMOL/ML IV SOLN
10.0000 mL | Freq: Once | INTRAVENOUS | Status: AC | PRN
  Administered 2017-12-12: 9 mL via INTRAVENOUS

## 2017-12-17 ENCOUNTER — Other Ambulatory Visit: Payer: Self-pay

## 2017-12-17 ENCOUNTER — Encounter: Payer: Self-pay | Admitting: Radiation Oncology

## 2017-12-17 ENCOUNTER — Ambulatory Visit
Admission: RE | Admit: 2017-12-17 | Payer: BLUE CROSS/BLUE SHIELD | Source: Ambulatory Visit | Admitting: Radiation Oncology

## 2017-12-17 ENCOUNTER — Ambulatory Visit
Admission: RE | Admit: 2017-12-17 | Discharge: 2017-12-17 | Disposition: A | Discharge status: Home / Self Care | Payer: BLUE CROSS/BLUE SHIELD | Source: Ambulatory Visit | Attending: Radiation Oncology | Admitting: Radiation Oncology

## 2017-12-17 VITALS — BP 123/74 | HR 83 | Temp 98.1°F | Resp 18 | Ht 72.0 in | Wt 212.8 lb

## 2017-12-17 DIAGNOSIS — C7972 Secondary malignant neoplasm of left adrenal gland: Secondary | ICD-10-CM | POA: Insufficient documentation

## 2017-12-17 DIAGNOSIS — C7931 Secondary malignant neoplasm of brain: Secondary | ICD-10-CM | POA: Insufficient documentation

## 2017-12-17 DIAGNOSIS — C3411 Malignant neoplasm of upper lobe, right bronchus or lung: Secondary | ICD-10-CM | POA: Diagnosis not present

## 2017-12-17 DIAGNOSIS — Z85841 Personal history of malignant neoplasm of brain: Secondary | ICD-10-CM | POA: Diagnosis not present

## 2017-12-17 DIAGNOSIS — Z8521 Personal history of malignant neoplasm of larynx: Secondary | ICD-10-CM | POA: Diagnosis not present

## 2017-12-17 DIAGNOSIS — Z85118 Personal history of other malignant neoplasm of bronchus and lung: Secondary | ICD-10-CM | POA: Diagnosis not present

## 2017-12-17 NOTE — Progress Notes (Signed)
Radiation Oncology         [phone] ________________________________  Name: Robert Powers MRN: [id number]  Date: 12/17/2017  DOB: 31-Jul-1955  Reevaluation note  CC: Maury Dus, MD  Georgina Quint, MD    ICD-10-CM   1. Small cell lung cancer, right upper lobe (HCC) C34.11   2. 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 T1N0 laryngeal cancer diagnosed in 2008 2. 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 Definitive radiotherapy in 2008 for laryngeal cancer  2. Concurrent CRT with weekly carboplatin/etoposide initiated on 09/03/16 - 10/26/16 - Radiation started with C2  3. WBRT completed on 04/16/17 4. 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 Patient has been on Pembrolizumab  which he seems to be tolerating well. Most recent PET scan shows significant activity in the left adrenal gland questionable activity in the head of the pancreas and right chest region. I reviewed the patient's PET scan carefully and the area  seems significant to me his only the left adrenal gland. His brain MRI shows no evidence metastasis. 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                            ALLERGIES:  is allergic to aspirin; doxazosin mesylate; itraconazole; norvasc [amlodipine besylate]; tekturna [aliskiren fumarate]; zestoretic [lisinopril-hydrochlorothiazide]; and benicar [olmesartan medoxomil].  Meds: Current Outpatient Medications  Medication Sig Dispense Refill  . albuterol (PROAIR HFA) 108 (90 Base)  MCG/ACT inhaler Inhale into the lungs.    [name] allopurinol (ZYLOPRIM) 300 MG tablet Take 300 mg by mouth daily.     [name] aspirin EC 81 MG tablet Take 81 mg by mouth daily.    . carvedilol (COREG) 6.25 MG tablet TK 1 T PO BID  2  . desloratadine-pseudoephedrine (CLARINEX-D 24-HOUR) 5-240 MG per 24 hr tablet Take 1 tablet by mouth daily.      [name] Dextromethorphan-Menthol (DELSYM COUGH RELIEF MT) Use as directed in the mouth or throat.    . diltiazem (CARDIZEM CD) 180 MG 24 hr capsule Take 180 mg by mouth daily.     [name] esomeprazole (NEXIUM) 20 MG capsule Take 20 mg by mouth daily at 12 noon.    . fenofibrate (TRICOR) 145 MG tablet Take 145 mg by mouth.    . fluticasone (FLONASE) 50 MCG/ACT nasal spray U 1 SPRAY IEN QPM  5  . guaiFENesin (MUCINEX) 600 MG 12 hr tablet Take by mouth 2 (two) times daily.    . hydrochlorothiazide (HYDRODIURIL) 25 MG tablet Take 25 mg by mouth daily.      [name] HYDROcodone-homatropine (HYCODAN) 5-1.5 MG/5ML syrup Take 5 mLs by mouth every 6 (six) hours as needed for cough. 180 mL 0  . LORazepam (ATIVAN) 0.5 MG tablet Take 1 tablet (0.5 mg total) by mouth every 8 (eight) hours. 30 tablet 0  . methylPREDNISolone (MEDROL DOSEPAK) 4 MG TBPK tablet Take 4 mg by mouth.    . metoprolol succinate (TOPROL-XL) 50 MG 24 hr  tablet TK 3 TS PO ONCE D  1  . metoprolol tartrate (LOPRESSOR) 25 MG tablet Take 150 mg by mouth once.     . Multiple Vitamin (MULTI-VITAMINS) TABS Take by mouth.    . potassium chloride SA (K-DUR,KLOR-CON) 20 MEQ tablet Take 1 tablet (20 mEq total) by mouth daily. 10 tablet 0  . rosuvastatin (CRESTOR) 10 MG tablet Take 5 mg by mouth daily.       No current facility-administered medications for this encounter.     Physical Findings: The patient is in no acute distress. Patient is alert and oriented.  height is 6' (1.829 m) and weight is 212 lb 12.8 oz (96.5 kg). His oral temperature is 98.1 F (36.7 C). His blood pressure is 123/74 and his pulse is 83. His respiration  is 18 and oxygen saturation is 100%. .   Lungs are clear to auscultation bilaterally. Heart has regular rate and rhythm. No palpable cervical, supraclavicular, or axillary adenopathy. Abdomen soft, non-tender, normal bowel sounds.the neurological exam is nonfocal  Lab Findings: Lab Results  Component Value Date   WBC 8.9 03/26/2017   HGB 13.9 03/26/2017   HCT 41.6 03/26/2017   MCV 96.5 03/26/2017   PLT 210 03/26/2017    Radiographic Findings: Mr Jeri Cos DU Contrast  Result Date: 12/13/2017 CLINICAL DATA:  Small-cell lung cancer. Previously treated brain metastases. EXAM: MRI HEAD WITHOUT AND WITH CONTRAST TECHNIQUE: Multiplanar, multiecho pulse sequences of the brain and surrounding structures were obtained without and with intravenous contrast. CONTRAST:  9 mL Gadavist COMPARISON:  09/12/2017 FINDINGS: Brain: There is no evidence of acute infarct, intracranial hemorrhage, midline shift, or extra-axial fluid collection. Patchy cerebral white matter T2 hyperintensities are unchanged and nonspecific but compatible with mild chronic small vessel ischemic disease. There is a 2 mm focus of enhancement in the inferior right cerebellum (series 11, image 10) corresponding to a previously treated metastasis, more conspicuous than on the prior study though unchanged from 05/27/2017. No enhancement is identified associated with the other previously treated lesions, and no new enhancing lesions are identified. Vascular: Major intracranial vascular flow voids are preserved. Skull and upper cervical spine: Unchanged 1 cm T1 hyperintense lesion in the right parietal bone just posterior to the coronal suture, likely a benign hemangioma. No suspicious skull lesion. Sinuses/Orbits: Unremarkable orbits. Clear paranasal sinuses. Small bilateral mastoid effusions. Other: None. IMPRESSION: No evidence of new or progressive intracranial metastatic disease. Electronically Signed   By: Logan Bores M.D.   On: 12/13/2017  09:21    Impression:  Small cell lung cancer, with relapse in the brain approximately 6 months ago. This is responded well to whole brain radiation therapy with recent brain MRI showing no active disease in this area. Recent PET scan shows activity in the left adrenal gland. Patient would be a good candidate for focused radiation therapy to this area. I discussed course of treatment side effects and potential toxicities of radiation therapy with the patient and his wife. He appears to understand and wishes to proceed with planned course of treatment  Plan:  Patient will proceed with stereotactic body radiation therapy planning for his left adrenal gland later today. He will receive approximately 5 fractions to this area. If, due to dose constraints of surrounding structures as of the kidney or small bowel, he were not a candidate for stereotactic body radiation therapy then the patient would be treated with hypo-fractionated radiation therapy over approximately 10 treatments.  ____________________________________ Gery Pray, MD

## 2017-12-17 NOTE — Progress Notes (Signed)
Pt presents today for f/u with Dr. Sondra Come re: PET scan. Pt scheduled for CT simulation immediately after this appt. Pt reports daily cough, occasional in frequency. Non productive. Pt denies hemoptysis. Pt denies difficulty swallowing. Pt reports SOB, worse with exertion. Pt reports rare headaches, possibly attributed to allergies/weather. Pt is accompanied by wife. Loma Sousa, RN BSN

## 2017-12-18 DIAGNOSIS — C7970 Secondary malignant neoplasm of unspecified adrenal gland: Secondary | ICD-10-CM | POA: Insufficient documentation

## 2017-12-18 NOTE — Progress Notes (Signed)
  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  Identity was confirmed.  All relevant records and images related to the planned course of therapy were reviewed.  The patient freely provided informed written consent to proceed with treatment after reviewing the details related to the planned course of therapy. The consent form was witnessed and verified by the simulation staff.  Then, the patient was set-up in a stable reproducible  supine position for radiation therapy.  A BodyFix immobilization pillow was fabricated for reproducible positioning.  Then I personally applied the abdominal compression paddle to limit respiratory excursion.  4D respiratoy motion management CT images were obtained.  Surface markings were placed.  The CT images were loaded into the planning software.  Then, using Cine, MIP, and standard views, the internal target volume (ITV) and planning target volumes (PTV) were delinieated, and avoidance structures were contoured.  Treatment planning then occurred.  The radiation prescription was entered and confirmed.  A total of two complex treatment devices were fabricated in the form of the BodyFix immobilization pillow and a neck accuform cushion.  I have requested : 3D Simulation  I have requested a DVH of the following structures: Heart, Lungs, Esophagus, Chest Wall, Brachial Plexus, Major Blood Vessels, and targets.  PLAN:  The patient will receive 50 Gy in 5 fractions directed at the left adrenal gland metastasis. Fractionation scheme and dose may change depending on results of stereotactic body radiation therapy planning particularly as it  relates to dose constraints concerning the left kidney and small bowel.  -----------------------------------  Blair Promise, PhD, MD

## 2017-12-23 DIAGNOSIS — C7972 Secondary malignant neoplasm of left adrenal gland: Secondary | ICD-10-CM | POA: Diagnosis not present

## 2017-12-23 DIAGNOSIS — C3411 Malignant neoplasm of upper lobe, right bronchus or lung: Secondary | ICD-10-CM | POA: Diagnosis not present

## 2017-12-23 DIAGNOSIS — C7931 Secondary malignant neoplasm of brain: Secondary | ICD-10-CM | POA: Diagnosis not present

## 2017-12-24 ENCOUNTER — Ambulatory Visit
Admission: RE | Admit: 2017-12-24 | Discharge: 2017-12-24 | Disposition: A | Discharge status: Home / Self Care | Payer: BLUE CROSS/BLUE SHIELD | Source: Ambulatory Visit | Attending: Radiation Oncology | Admitting: Radiation Oncology

## 2017-12-24 DIAGNOSIS — C7972 Secondary malignant neoplasm of left adrenal gland: Secondary | ICD-10-CM | POA: Diagnosis not present

## 2017-12-24 DIAGNOSIS — C3411 Malignant neoplasm of upper lobe, right bronchus or lung: Secondary | ICD-10-CM | POA: Diagnosis not present

## 2017-12-24 DIAGNOSIS — C7931 Secondary malignant neoplasm of brain: Secondary | ICD-10-CM | POA: Diagnosis not present

## 2017-12-24 NOTE — Progress Notes (Signed)
  Radiation Oncology         [phone] ________________________________  Name: Robert Powers MRN: [id number]  Date: 12/24/2017  DOB: 01/11/1956  Stereotactic Body Radiotherapy Treatment Procedure Note  NARRATIVE:  Robert Powers was brought to the stereotactic radiation treatment machine and placed supine on the CT couch. The patient was set up for stereotactic body radiotherapy on the body fix pillow.  3D TREATMENT PLANNING AND DOSIMETRY:  The patient's radiation plan was reviewed and approved prior to starting treatment.  It showed 3-dimensional radiation distributions overlaid onto the planning CT.  The [facility] for the target structures as well as the organs at risk were reviewed. The documentation of this is filed in the radiation oncology EMR.  SIMULATION VERIFICATION:  The patient underwent CT imaging on the treatment unit.  These were carefully aligned to document that the ablative radiation dose would cover the target volume and maximally spare the nearby organs at risk according to the planned distribution.  SPECIAL TREATMENT PROCEDURE: Robert Powers received high dose ablative stereotactic body radiotherapy to the planned target volume without unforeseen complications. Treatment was delivered uneventfully. The high doses associated with stereotactic body radiotherapy and the significant potential risks require careful treatment set up and patient monitoring constituting a special treatment procedure   STEREOTACTIC TREATMENT MANAGEMENT:  Following delivery, the patient was evaluated clinically. The patient tolerated treatment without significant acute effects, and was discharged to home in stable condition.    PLAN: Continue treatment as planned.  ________________________________  Blair Promise, PhD, MD

## 2017-12-26 ENCOUNTER — Ambulatory Visit
Admission: RE | Admit: 2017-12-26 | Discharge: 2017-12-26 | Disposition: A | Discharge status: Home / Self Care | Payer: BLUE CROSS/BLUE SHIELD | Source: Ambulatory Visit | Attending: Radiation Oncology | Admitting: Radiation Oncology

## 2017-12-26 DIAGNOSIS — C3411 Malignant neoplasm of upper lobe, right bronchus or lung: Secondary | ICD-10-CM | POA: Diagnosis not present

## 2017-12-26 DIAGNOSIS — C7972 Secondary malignant neoplasm of left adrenal gland: Secondary | ICD-10-CM

## 2017-12-26 DIAGNOSIS — C7931 Secondary malignant neoplasm of brain: Secondary | ICD-10-CM | POA: Diagnosis not present

## 2017-12-26 NOTE — Progress Notes (Signed)
  Radiation Oncology         [phone] ________________________________  Name: Robert Powers MRN: [id number]  Date: 12/26/2017  DOB: 1955-11-06  Stereotactic Body Radiotherapy Treatment Procedure Note  NARRATIVE:  RONNEY HONEYWELL was brought to the stereotactic radiation treatment machine and placed supine on the CT couch. The patient was set up for stereotactic body radiotherapy on the body fix pillow.  3D TREATMENT PLANNING AND DOSIMETRY:  The patient's radiation plan was reviewed and approved prior to starting treatment.  It showed 3-dimensional radiation distributions overlaid onto the planning CT.  The [facility] for the target structures as well as the organs at risk were reviewed. The documentation of this is filed in the radiation oncology EMR.  SIMULATION VERIFICATION:  The patient underwent CT imaging on the treatment unit.  These were carefully aligned to document that the ablative radiation dose would cover the target volume and maximally spare the nearby organs at risk according to the planned distribution.  SPECIAL TREATMENT PROCEDURE: [name] received high dose ablative stereotactic body radiotherapy to the planned target volume without unforeseen complications. Treatment was delivered uneventfully. The high doses associated with stereotactic body radiotherapy and the significant potential risks require careful treatment set up and patient monitoring constituting a special treatment procedure   STEREOTACTIC TREATMENT MANAGEMENT:  Following delivery, the patient was evaluated clinically. The patient tolerated treatment without significant acute effects, and was discharged to home in stable condition.    PLAN: Continue treatment as planned.  ________________________________  Blair Promise, PhD, MD   This document serves as a record of services personally performed by Gery Pray, MD. It was created on his behalf by Wilburn Mylar, a trained medical scribe. The creation of  this record is based on the scribe's personal observations and the provider's statements to them. This document has been checked and approved by the attending provider.

## 2017-12-30 ENCOUNTER — Ambulatory Visit
Admission: RE | Admit: 2017-12-30 | Discharge: 2017-12-30 | Disposition: A | Discharge status: Home / Self Care | Payer: BLUE CROSS/BLUE SHIELD | Source: Ambulatory Visit | Attending: Radiation Oncology | Admitting: Radiation Oncology

## 2017-12-30 DIAGNOSIS — C7931 Secondary malignant neoplasm of brain: Secondary | ICD-10-CM | POA: Diagnosis not present

## 2017-12-30 DIAGNOSIS — C3411 Malignant neoplasm of upper lobe, right bronchus or lung: Secondary | ICD-10-CM | POA: Diagnosis not present

## 2017-12-30 DIAGNOSIS — C7972 Secondary malignant neoplasm of left adrenal gland: Secondary | ICD-10-CM

## 2017-12-30 NOTE — Progress Notes (Signed)
  Radiation Oncology         [phone] ________________________________  Name: Robert Powers MRN: [id number]  Date: 12/30/2017  DOB: Jun 23, 1955  Stereotactic Body Radiotherapy Treatment Procedure Note  NARRATIVE:  Robert Powers was brought to the stereotactic radiation treatment machine and placed supine on the CT couch. The patient was set up for stereotactic body radiotherapy on the body fix pillow.  3D TREATMENT PLANNING AND DOSIMETRY:  The patient's radiation plan was reviewed and approved prior to starting treatment.  It showed 3-dimensional radiation distributions overlaid onto the planning CT.  The [facility] for the target structures as well as the organs at risk were reviewed. The documentation of this is filed in the radiation oncology EMR.  SIMULATION VERIFICATION:  The patient underwent CT imaging on the treatment unit.  These were carefully aligned to document that the ablative radiation dose would cover the target volume and maximally spare the nearby organs at risk according to the planned distribution.  SPECIAL TREATMENT PROCEDURE: Robert Powers received high dose ablative stereotactic body radiotherapy to the planned target volume without unforeseen complications. Treatment was delivered uneventfully. The high doses associated with stereotactic body radiotherapy and the significant potential risks require careful treatment set up and patient monitoring constituting a special treatment procedure   STEREOTACTIC TREATMENT MANAGEMENT:  Following delivery, the patient was evaluated clinically. The patient tolerated treatment without significant acute effects, and was discharged to home in stable condition.    PLAN: Continue treatment as planned.  ________________________________  Blair Promise, PhD, MD  This document serves as a record of services personally performed by Gery Pray, MD. It was created on his behalf by Mary-Margaret Loma Messing, a trained medical scribe. The creation of  this record is based on the scribe's personal observations and the provider's statements to them. This document has been checked and approved by the attending provider.

## 2017-12-31 ENCOUNTER — Ambulatory Visit: Payer: BLUE CROSS/BLUE SHIELD | Admitting: Radiation Oncology

## 2018-01-01 ENCOUNTER — Ambulatory Visit
Admission: RE | Admit: 2018-01-01 | Discharge: 2018-01-01 | Disposition: A | Discharge status: Home / Self Care | Payer: BLUE CROSS/BLUE SHIELD | Source: Ambulatory Visit | Attending: Radiation Oncology | Admitting: Radiation Oncology

## 2018-01-01 DIAGNOSIS — C7931 Secondary malignant neoplasm of brain: Secondary | ICD-10-CM | POA: Diagnosis not present

## 2018-01-01 DIAGNOSIS — C7972 Secondary malignant neoplasm of left adrenal gland: Secondary | ICD-10-CM

## 2018-01-01 DIAGNOSIS — C3411 Malignant neoplasm of upper lobe, right bronchus or lung: Secondary | ICD-10-CM | POA: Diagnosis not present

## 2018-01-01 NOTE — Progress Notes (Signed)
  Radiation Oncology         [phone] ________________________________  Name: Robert Powers MRN: [id number]  Date: 01/01/2018  DOB: [birth year]  Stereotactic Body Radiotherapy Treatment Procedure Note  NARRATIVE:  Robert Powers was brought to the stereotactic radiation treatment machine and placed supine on the CT couch. The patient was set up for stereotactic body radiotherapy on the body fix pillow.  3D TREATMENT PLANNING AND DOSIMETRY:  The patient's radiation plan was reviewed and approved prior to starting treatment.  It showed 3-dimensional radiation distributions overlaid onto the planning CT.  The [facility] for the target structures as well as the organs at risk were reviewed. The documentation of this is filed in the radiation oncology EMR.  SIMULATION VERIFICATION:  The patient underwent CT imaging on the treatment unit.  These were carefully aligned to document that the ablative radiation dose would cover the target volume and maximally spare the nearby organs at risk according to the planned distribution.  SPECIAL TREATMENT PROCEDURE: Robert Powers received high dose ablative stereotactic body radiotherapy to the planned target volume without unforeseen complications. Treatment was delivered uneventfully. The high doses associated with stereotactic body radiotherapy and the significant potential risks require careful treatment set up and patient monitoring constituting a special treatment procedure   STEREOTACTIC TREATMENT MANAGEMENT:  Following delivery, the patient was evaluated clinically. The patient tolerated treatment without significant acute effects, and was discharged to home in stable condition.    PLAN: Continue treatment as planned.  ________________________________  Blair Promise, PhD, MD  This document serves as a record of services personally performed by Gery Pray, MD. It was created on his behalf by Mary-Margaret Loma Messing, a trained medical scribe. The creation of  this record is based on the scribe's personal observations and the provider's statements to them. This document has been checked and approved by the attending provider.

## 2018-01-03 ENCOUNTER — Ambulatory Visit
Admission: RE | Admit: 2018-01-03 | Discharge: 2018-01-03 | Disposition: A | Discharge status: Home / Self Care | Payer: BLUE CROSS/BLUE SHIELD | Source: Ambulatory Visit | Attending: Radiation Oncology | Admitting: Radiation Oncology

## 2018-01-03 DIAGNOSIS — C7972 Secondary malignant neoplasm of left adrenal gland: Secondary | ICD-10-CM | POA: Diagnosis not present

## 2018-01-03 DIAGNOSIS — C3411 Malignant neoplasm of upper lobe, right bronchus or lung: Secondary | ICD-10-CM | POA: Diagnosis not present

## 2018-01-03 DIAGNOSIS — C7931 Secondary malignant neoplasm of brain: Secondary | ICD-10-CM | POA: Diagnosis not present

## 2018-01-07 DIAGNOSIS — C3411 Malignant neoplasm of upper lobe, right bronchus or lung: Secondary | ICD-10-CM | POA: Diagnosis not present

## 2018-01-07 DIAGNOSIS — Z8521 Personal history of malignant neoplasm of larynx: Secondary | ICD-10-CM | POA: Diagnosis not present

## 2018-01-07 DIAGNOSIS — Z79899 Other long term (current) drug therapy: Secondary | ICD-10-CM | POA: Diagnosis not present

## 2018-01-07 DIAGNOSIS — C7972 Secondary malignant neoplasm of left adrenal gland: Secondary | ICD-10-CM | POA: Diagnosis not present

## 2018-01-07 DIAGNOSIS — E278 Other specified disorders of adrenal gland: Secondary | ICD-10-CM | POA: Diagnosis not present

## 2018-01-07 DIAGNOSIS — C7931 Secondary malignant neoplasm of brain: Secondary | ICD-10-CM | POA: Diagnosis not present

## 2018-01-07 DIAGNOSIS — C3491 Malignant neoplasm of unspecified part of right bronchus or lung: Secondary | ICD-10-CM | POA: Diagnosis not present

## 2018-02-03 DIAGNOSIS — L299 Pruritus, unspecified: Secondary | ICD-10-CM | POA: Diagnosis not present

## 2018-02-03 DIAGNOSIS — C7931 Secondary malignant neoplasm of brain: Secondary | ICD-10-CM | POA: Diagnosis not present

## 2018-02-03 DIAGNOSIS — C7972 Secondary malignant neoplasm of left adrenal gland: Secondary | ICD-10-CM | POA: Diagnosis not present

## 2018-02-03 DIAGNOSIS — Z923 Personal history of irradiation: Secondary | ICD-10-CM | POA: Diagnosis not present

## 2018-02-03 DIAGNOSIS — Z79899 Other long term (current) drug therapy: Secondary | ICD-10-CM | POA: Diagnosis not present

## 2018-02-03 DIAGNOSIS — Z87891 Personal history of nicotine dependence: Secondary | ICD-10-CM | POA: Diagnosis not present

## 2018-02-03 DIAGNOSIS — Z1329 Encounter for screening for other suspected endocrine disorder: Secondary | ICD-10-CM | POA: Diagnosis not present

## 2018-02-03 DIAGNOSIS — C3490 Malignant neoplasm of unspecified part of unspecified bronchus or lung: Secondary | ICD-10-CM | POA: Diagnosis not present

## 2018-02-03 DIAGNOSIS — C3411 Malignant neoplasm of upper lobe, right bronchus or lung: Secondary | ICD-10-CM | POA: Diagnosis not present

## 2018-02-03 DIAGNOSIS — R590 Localized enlarged lymph nodes: Secondary | ICD-10-CM | POA: Diagnosis not present

## 2018-02-24 ENCOUNTER — Other Ambulatory Visit: Payer: Self-pay | Admitting: Radiation Oncology

## 2018-02-24 ENCOUNTER — Ambulatory Visit
Admission: RE | Admit: 2018-02-24 | Discharge: 2018-02-24 | Disposition: A | Discharge status: Home / Self Care | Payer: Self-pay | Source: Ambulatory Visit | Attending: Radiation Oncology | Admitting: Radiation Oncology

## 2018-02-24 DIAGNOSIS — C3490 Malignant neoplasm of unspecified part of unspecified bronchus or lung: Secondary | ICD-10-CM

## 2018-02-24 DIAGNOSIS — C3411 Malignant neoplasm of upper lobe, right bronchus or lung: Secondary | ICD-10-CM | POA: Diagnosis not present

## 2018-02-24 DIAGNOSIS — L299 Pruritus, unspecified: Secondary | ICD-10-CM | POA: Diagnosis not present

## 2018-02-24 DIAGNOSIS — E278 Other specified disorders of adrenal gland: Secondary | ICD-10-CM | POA: Diagnosis not present

## 2018-02-24 DIAGNOSIS — Z79899 Other long term (current) drug therapy: Secondary | ICD-10-CM | POA: Diagnosis not present

## 2018-02-24 DIAGNOSIS — C3432 Malignant neoplasm of lower lobe, left bronchus or lung: Secondary | ICD-10-CM | POA: Diagnosis not present

## 2018-02-24 DIAGNOSIS — C7972 Secondary malignant neoplasm of left adrenal gland: Secondary | ICD-10-CM | POA: Diagnosis not present

## 2018-02-24 DIAGNOSIS — R918 Other nonspecific abnormal finding of lung field: Secondary | ICD-10-CM | POA: Diagnosis not present

## 2018-03-03 ENCOUNTER — Encounter: Payer: Self-pay | Admitting: Radiation Oncology

## 2018-03-03 NOTE — Progress Notes (Signed)
  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     At the beginning of treatment, pt reported mild fatigue and nausea. Pt denied vomiting, and bowel trouble throughout treatments. Overall the pt was without complaints.   Plan: The patient has completed radiation treatment. The patient will return to radiation oncology clinic for routine followup in one month. I advised them to call or return sooner if they have any questions or concerns related to their recovery or treatment.  -----------------------------------  Blair Promise, PhD, MD  This document serves as a record of services personally performed by Gery Pray, MD. It was created on his behalf by Mary-Margaret Loma Messing, a trained medical scribe. The creation of this record is based on the scribe's personal observations and the provider's statements to them. This document has been checked and approved by the attending provider.

## 2018-03-13 DIAGNOSIS — C3411 Malignant neoplasm of upper lobe, right bronchus or lung: Secondary | ICD-10-CM | POA: Diagnosis not present

## 2018-03-13 DIAGNOSIS — E782 Mixed hyperlipidemia: Secondary | ICD-10-CM | POA: Diagnosis not present

## 2018-03-13 DIAGNOSIS — I10 Essential (primary) hypertension: Secondary | ICD-10-CM | POA: Diagnosis not present

## 2018-03-13 DIAGNOSIS — M109 Gout, unspecified: Secondary | ICD-10-CM | POA: Diagnosis not present

## 2018-03-17 DIAGNOSIS — C7931 Secondary malignant neoplasm of brain: Secondary | ICD-10-CM | POA: Diagnosis not present

## 2018-03-17 DIAGNOSIS — L299 Pruritus, unspecified: Secondary | ICD-10-CM | POA: Diagnosis not present

## 2018-03-17 DIAGNOSIS — Z8521 Personal history of malignant neoplasm of larynx: Secondary | ICD-10-CM | POA: Diagnosis not present

## 2018-03-17 DIAGNOSIS — C3490 Malignant neoplasm of unspecified part of unspecified bronchus or lung: Secondary | ICD-10-CM | POA: Diagnosis not present

## 2018-03-17 DIAGNOSIS — C7972 Secondary malignant neoplasm of left adrenal gland: Secondary | ICD-10-CM | POA: Diagnosis not present

## 2018-03-17 DIAGNOSIS — C3411 Malignant neoplasm of upper lobe, right bronchus or lung: Secondary | ICD-10-CM | POA: Diagnosis not present

## 2018-03-17 DIAGNOSIS — Z87891 Personal history of nicotine dependence: Secondary | ICD-10-CM | POA: Diagnosis not present

## 2018-03-17 DIAGNOSIS — Z79899 Other long term (current) drug therapy: Secondary | ICD-10-CM | POA: Diagnosis not present

## 2018-04-07 DIAGNOSIS — C3411 Malignant neoplasm of upper lobe, right bronchus or lung: Secondary | ICD-10-CM | POA: Diagnosis not present

## 2018-04-07 DIAGNOSIS — C7971 Secondary malignant neoplasm of right adrenal gland: Secondary | ICD-10-CM | POA: Diagnosis not present

## 2018-04-07 DIAGNOSIS — H9190 Unspecified hearing loss, unspecified ear: Secondary | ICD-10-CM | POA: Diagnosis not present

## 2018-04-07 DIAGNOSIS — Z923 Personal history of irradiation: Secondary | ICD-10-CM | POA: Diagnosis not present

## 2018-04-07 DIAGNOSIS — C329 Malignant neoplasm of larynx, unspecified: Secondary | ICD-10-CM | POA: Diagnosis not present

## 2018-04-07 DIAGNOSIS — L299 Pruritus, unspecified: Secondary | ICD-10-CM | POA: Diagnosis not present

## 2018-04-07 DIAGNOSIS — Z5111 Encounter for antineoplastic chemotherapy: Secondary | ICD-10-CM | POA: Diagnosis not present

## 2018-04-07 DIAGNOSIS — C7931 Secondary malignant neoplasm of brain: Secondary | ICD-10-CM | POA: Diagnosis not present

## 2018-04-07 DIAGNOSIS — C7972 Secondary malignant neoplasm of left adrenal gland: Secondary | ICD-10-CM | POA: Diagnosis not present

## 2018-04-07 DIAGNOSIS — C3491 Malignant neoplasm of unspecified part of right bronchus or lung: Secondary | ICD-10-CM | POA: Diagnosis not present

## 2018-04-07 DIAGNOSIS — Z87891 Personal history of nicotine dependence: Secondary | ICD-10-CM | POA: Diagnosis not present

## 2018-04-07 DIAGNOSIS — Z79899 Other long term (current) drug therapy: Secondary | ICD-10-CM | POA: Diagnosis not present

## 2018-04-10 DIAGNOSIS — H6123 Impacted cerumen, bilateral: Secondary | ICD-10-CM | POA: Diagnosis not present

## 2018-04-10 DIAGNOSIS — H903 Sensorineural hearing loss, bilateral: Secondary | ICD-10-CM | POA: Diagnosis not present

## 2018-04-10 DIAGNOSIS — H9313 Tinnitus, bilateral: Secondary | ICD-10-CM | POA: Diagnosis not present

## 2018-04-28 DIAGNOSIS — Z5112 Encounter for antineoplastic immunotherapy: Secondary | ICD-10-CM | POA: Diagnosis not present

## 2018-04-28 DIAGNOSIS — C7972 Secondary malignant neoplasm of left adrenal gland: Secondary | ICD-10-CM | POA: Diagnosis not present

## 2018-04-28 DIAGNOSIS — Z79899 Other long term (current) drug therapy: Secondary | ICD-10-CM | POA: Diagnosis not present

## 2018-04-28 DIAGNOSIS — C3411 Malignant neoplasm of upper lobe, right bronchus or lung: Secondary | ICD-10-CM | POA: Diagnosis not present

## 2018-05-20 DIAGNOSIS — H9190 Unspecified hearing loss, unspecified ear: Secondary | ICD-10-CM | POA: Diagnosis not present

## 2018-05-20 DIAGNOSIS — C7931 Secondary malignant neoplasm of brain: Secondary | ICD-10-CM | POA: Diagnosis not present

## 2018-05-20 DIAGNOSIS — R0602 Shortness of breath: Secondary | ICD-10-CM | POA: Diagnosis not present

## 2018-05-20 DIAGNOSIS — L299 Pruritus, unspecified: Secondary | ICD-10-CM | POA: Diagnosis not present

## 2018-05-20 DIAGNOSIS — C3490 Malignant neoplasm of unspecified part of unspecified bronchus or lung: Secondary | ICD-10-CM | POA: Diagnosis not present

## 2018-05-20 DIAGNOSIS — C3411 Malignant neoplasm of upper lobe, right bronchus or lung: Secondary | ICD-10-CM | POA: Diagnosis not present

## 2018-05-20 DIAGNOSIS — Z79899 Other long term (current) drug therapy: Secondary | ICD-10-CM | POA: Diagnosis not present

## 2018-05-20 DIAGNOSIS — Z8521 Personal history of malignant neoplasm of larynx: Secondary | ICD-10-CM | POA: Diagnosis not present

## 2018-05-20 DIAGNOSIS — E278 Other specified disorders of adrenal gland: Secondary | ICD-10-CM | POA: Diagnosis not present

## 2018-06-16 DIAGNOSIS — C7931 Secondary malignant neoplasm of brain: Secondary | ICD-10-CM | POA: Diagnosis not present

## 2018-06-16 DIAGNOSIS — C3491 Malignant neoplasm of unspecified part of right bronchus or lung: Secondary | ICD-10-CM | POA: Diagnosis not present

## 2018-06-16 DIAGNOSIS — Z8521 Personal history of malignant neoplasm of larynx: Secondary | ICD-10-CM | POA: Diagnosis not present

## 2018-06-16 DIAGNOSIS — H9190 Unspecified hearing loss, unspecified ear: Secondary | ICD-10-CM | POA: Diagnosis not present

## 2018-06-16 DIAGNOSIS — Z79899 Other long term (current) drug therapy: Secondary | ICD-10-CM | POA: Diagnosis not present

## 2018-06-16 DIAGNOSIS — R918 Other nonspecific abnormal finding of lung field: Secondary | ICD-10-CM | POA: Diagnosis not present

## 2018-06-16 DIAGNOSIS — C3411 Malignant neoplasm of upper lobe, right bronchus or lung: Secondary | ICD-10-CM | POA: Diagnosis not present

## 2018-06-16 DIAGNOSIS — C3432 Malignant neoplasm of lower lobe, left bronchus or lung: Secondary | ICD-10-CM | POA: Diagnosis not present

## 2018-06-16 DIAGNOSIS — Z5111 Encounter for antineoplastic chemotherapy: Secondary | ICD-10-CM | POA: Diagnosis not present

## 2018-06-16 DIAGNOSIS — L299 Pruritus, unspecified: Secondary | ICD-10-CM | POA: Diagnosis not present

## 2018-06-16 DIAGNOSIS — C7971 Secondary malignant neoplasm of right adrenal gland: Secondary | ICD-10-CM | POA: Diagnosis not present

## 2018-06-16 DIAGNOSIS — E279 Disorder of adrenal gland, unspecified: Secondary | ICD-10-CM | POA: Diagnosis not present

## 2018-06-16 DIAGNOSIS — R590 Localized enlarged lymph nodes: Secondary | ICD-10-CM | POA: Diagnosis not present

## 2018-06-16 DIAGNOSIS — R0602 Shortness of breath: Secondary | ICD-10-CM | POA: Diagnosis not present

## 2018-06-20 DIAGNOSIS — C3490 Malignant neoplasm of unspecified part of unspecified bronchus or lung: Secondary | ICD-10-CM | POA: Diagnosis not present

## 2018-06-20 DIAGNOSIS — C3491 Malignant neoplasm of unspecified part of right bronchus or lung: Secondary | ICD-10-CM | POA: Diagnosis not present

## 2018-06-20 DIAGNOSIS — C257 Malignant neoplasm of other parts of pancreas: Secondary | ICD-10-CM | POA: Diagnosis not present

## 2018-06-23 DIAGNOSIS — C3490 Malignant neoplasm of unspecified part of unspecified bronchus or lung: Secondary | ICD-10-CM | POA: Diagnosis not present

## 2018-07-07 DIAGNOSIS — C3490 Malignant neoplasm of unspecified part of unspecified bronchus or lung: Secondary | ICD-10-CM | POA: Diagnosis not present

## 2018-07-07 DIAGNOSIS — C7931 Secondary malignant neoplasm of brain: Secondary | ICD-10-CM | POA: Diagnosis not present

## 2018-07-07 DIAGNOSIS — Z006 Encounter for examination for normal comparison and control in clinical research program: Secondary | ICD-10-CM | POA: Diagnosis not present

## 2018-07-07 DIAGNOSIS — H9190 Unspecified hearing loss, unspecified ear: Secondary | ICD-10-CM | POA: Diagnosis not present

## 2018-07-07 DIAGNOSIS — Z8521 Personal history of malignant neoplasm of larynx: Secondary | ICD-10-CM | POA: Diagnosis not present

## 2018-07-07 DIAGNOSIS — R12 Heartburn: Secondary | ICD-10-CM | POA: Diagnosis not present

## 2018-07-07 DIAGNOSIS — Z79899 Other long term (current) drug therapy: Secondary | ICD-10-CM | POA: Diagnosis not present

## 2018-07-07 DIAGNOSIS — E279 Disorder of adrenal gland, unspecified: Secondary | ICD-10-CM | POA: Diagnosis not present

## 2018-07-07 DIAGNOSIS — C3411 Malignant neoplasm of upper lobe, right bronchus or lung: Secondary | ICD-10-CM | POA: Diagnosis not present

## 2018-07-21 DIAGNOSIS — C7931 Secondary malignant neoplasm of brain: Secondary | ICD-10-CM | POA: Diagnosis not present

## 2018-07-21 DIAGNOSIS — Z5111 Encounter for antineoplastic chemotherapy: Secondary | ICD-10-CM | POA: Diagnosis not present

## 2018-07-21 DIAGNOSIS — Z006 Encounter for examination for normal comparison and control in clinical research program: Secondary | ICD-10-CM | POA: Diagnosis not present

## 2018-07-21 DIAGNOSIS — C3411 Malignant neoplasm of upper lobe, right bronchus or lung: Secondary | ICD-10-CM | POA: Diagnosis not present

## 2018-07-21 DIAGNOSIS — Z79899 Other long term (current) drug therapy: Secondary | ICD-10-CM | POA: Diagnosis not present

## 2018-08-04 DIAGNOSIS — Z006 Encounter for examination for normal comparison and control in clinical research program: Secondary | ICD-10-CM | POA: Diagnosis not present

## 2018-08-04 DIAGNOSIS — Z87891 Personal history of nicotine dependence: Secondary | ICD-10-CM | POA: Diagnosis not present

## 2018-08-04 DIAGNOSIS — C3411 Malignant neoplasm of upper lobe, right bronchus or lung: Secondary | ICD-10-CM | POA: Diagnosis not present

## 2018-08-04 DIAGNOSIS — C3490 Malignant neoplasm of unspecified part of unspecified bronchus or lung: Secondary | ICD-10-CM | POA: Diagnosis not present

## 2018-08-04 DIAGNOSIS — Z79899 Other long term (current) drug therapy: Secondary | ICD-10-CM | POA: Diagnosis not present

## 2018-08-04 DIAGNOSIS — R12 Heartburn: Secondary | ICD-10-CM | POA: Diagnosis not present

## 2018-08-04 DIAGNOSIS — Z8521 Personal history of malignant neoplasm of larynx: Secondary | ICD-10-CM | POA: Diagnosis not present

## 2018-08-04 DIAGNOSIS — E279 Disorder of adrenal gland, unspecified: Secondary | ICD-10-CM | POA: Diagnosis not present

## 2018-08-04 DIAGNOSIS — T451X5A Adverse effect of antineoplastic and immunosuppressive drugs, initial encounter: Secondary | ICD-10-CM | POA: Diagnosis not present

## 2018-08-04 DIAGNOSIS — C7931 Secondary malignant neoplasm of brain: Secondary | ICD-10-CM | POA: Diagnosis not present

## 2018-08-18 DIAGNOSIS — Z87891 Personal history of nicotine dependence: Secondary | ICD-10-CM | POA: Diagnosis not present

## 2018-08-18 DIAGNOSIS — Z006 Encounter for examination for normal comparison and control in clinical research program: Secondary | ICD-10-CM | POA: Diagnosis not present

## 2018-08-18 DIAGNOSIS — Z8521 Personal history of malignant neoplasm of larynx: Secondary | ICD-10-CM | POA: Diagnosis not present

## 2018-08-18 DIAGNOSIS — C3490 Malignant neoplasm of unspecified part of unspecified bronchus or lung: Secondary | ICD-10-CM | POA: Diagnosis not present

## 2018-08-18 DIAGNOSIS — E279 Disorder of adrenal gland, unspecified: Secondary | ICD-10-CM | POA: Diagnosis not present

## 2018-08-18 DIAGNOSIS — C7931 Secondary malignant neoplasm of brain: Secondary | ICD-10-CM | POA: Diagnosis not present

## 2018-08-18 DIAGNOSIS — C3411 Malignant neoplasm of upper lobe, right bronchus or lung: Secondary | ICD-10-CM | POA: Diagnosis not present

## 2018-08-18 DIAGNOSIS — Z79899 Other long term (current) drug therapy: Secondary | ICD-10-CM | POA: Diagnosis not present

## 2018-08-18 DIAGNOSIS — R5383 Other fatigue: Secondary | ICD-10-CM | POA: Diagnosis not present

## 2018-08-18 DIAGNOSIS — H9190 Unspecified hearing loss, unspecified ear: Secondary | ICD-10-CM | POA: Diagnosis not present

## 2018-08-18 DIAGNOSIS — R110 Nausea: Secondary | ICD-10-CM | POA: Diagnosis not present

## 2018-08-22 IMAGING — MR MR HEAD WO/W CM
10 of 14 series · 34 of 48 positions shown · IV contrast (multihance)
Comparison: MRI head 05/27/2017

CLINICAL DATA: Small cell lung cancer. Previous brain metastasis
have been treated. Assess response to treatment

EXAM:
MRI HEAD WITHOUT AND WITH CONTRAST
TECHNIQUE: Multiplanar, multiecho pulse sequences of the brain and surrounding
structures were obtained without and with intravenous contrast.
CONTRAST:  20mL MULTIHANCE GADOBENATE DIMEGLUMINE 529 MG/ML IV SOLN

[Series 3: DWI · axial · 3.0mm · 1.09mm/px · z∈[-78,+80]mm · 9 of 110 slices shown (1 of 4)]
[im 1/110]
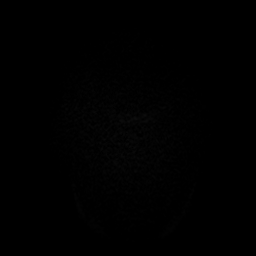
[im 14/110]
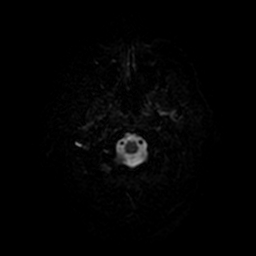
[im 28/110]
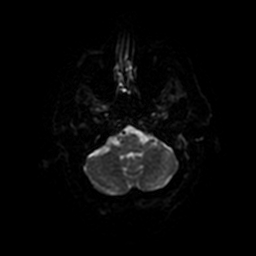
[im 41/110]
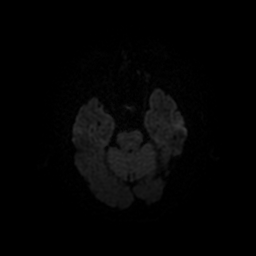
[im 55/110]
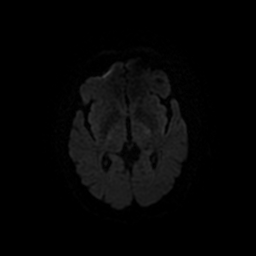
[im 69/110]
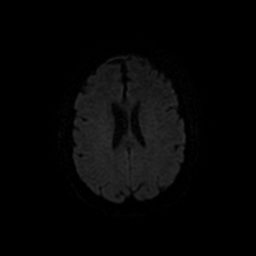
[im 82/110]
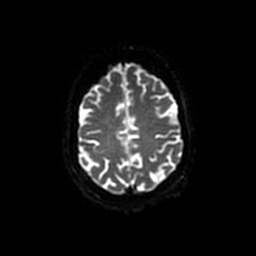
[im 96/110]
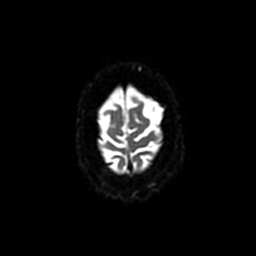
[im 110/110]
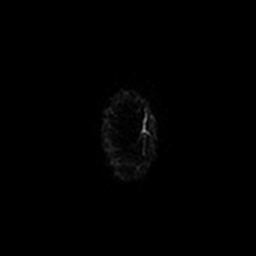

[Series 5: DWI · coronal · 4.0mm · 1.09mm/px · 6 of 70 slices shown (2 of 4)]
[im 1/70]
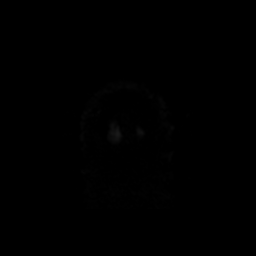
[im 14/70]
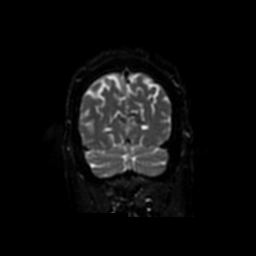
[im 28/70]
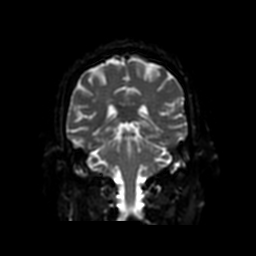
[im 42/70]
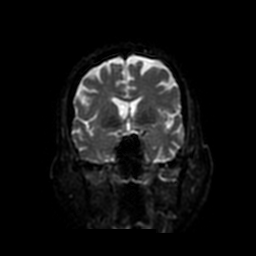
[im 56/70]
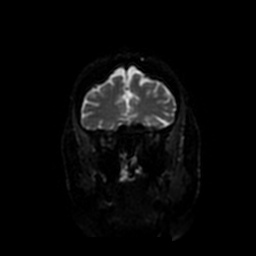
[im 70/70]
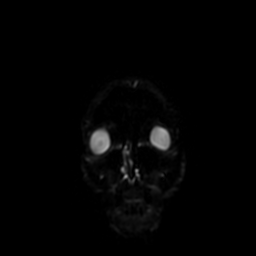

[Series 6: T2 · axial · 5.0mm · 0.43mm/px · z∈[-84,+72]mm · 2 of 24 slices shown (1 of 2)]
[im 1/24]
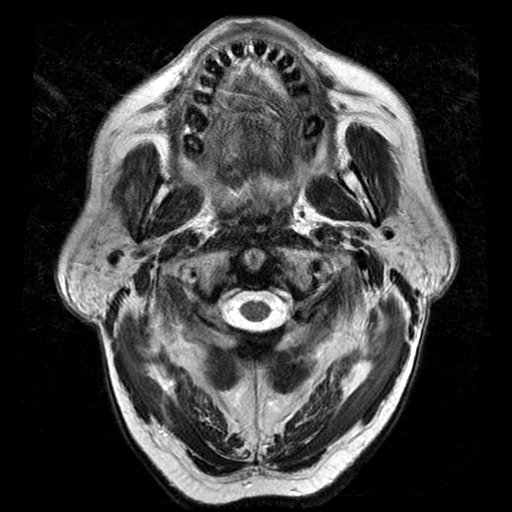
[im 24/24]
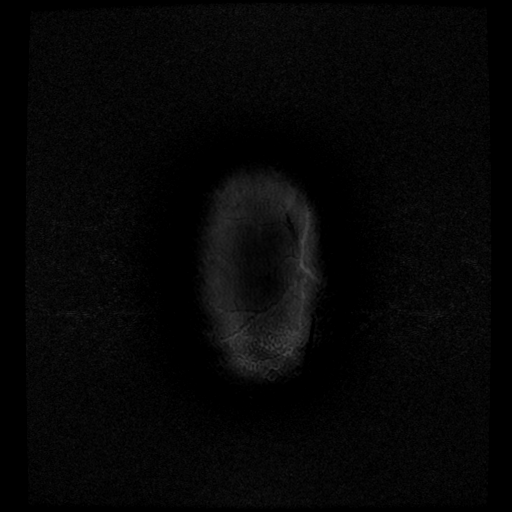

[Series 7: FLAIR · axial · 3.0mm · 0.43mm/px · z∈[-85,+73]mm · 2 of 28 slices shown]
[im 1/28]
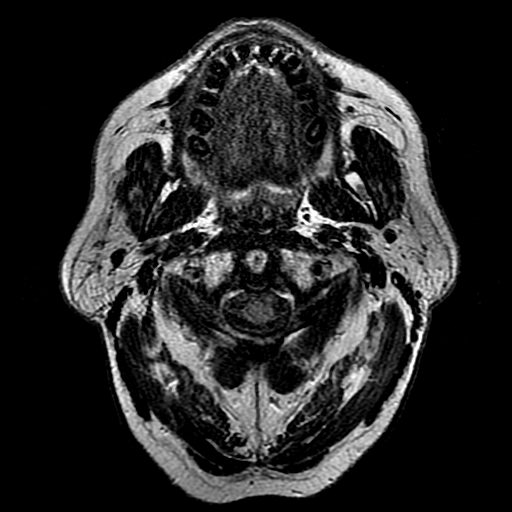
[im 28/28]
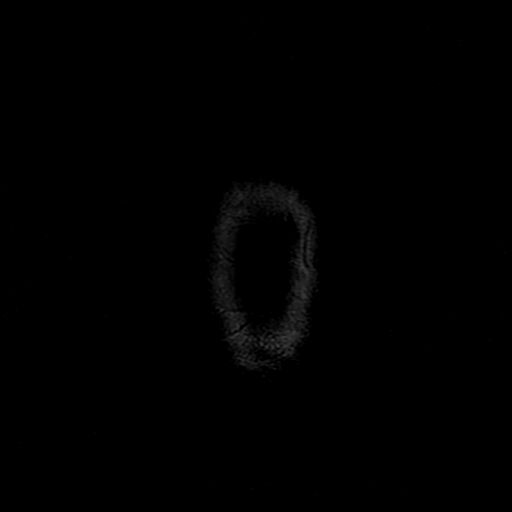

[Series 8: T2 · axial · 5.0mm · 0.43mm/px · z∈[-85,+73]mm · 2 of 28 slices shown (2 of 2)]
[im 1/28]
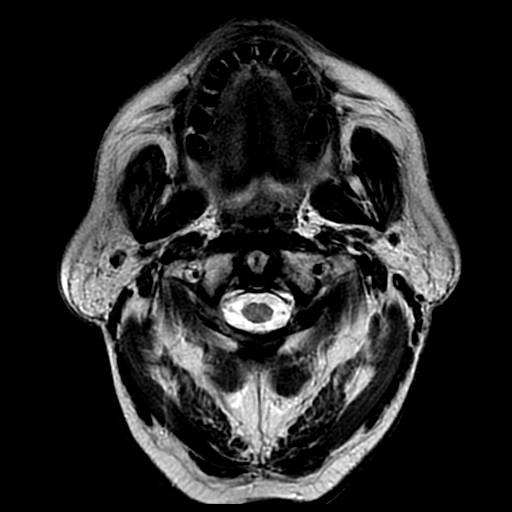
[im 28/28]
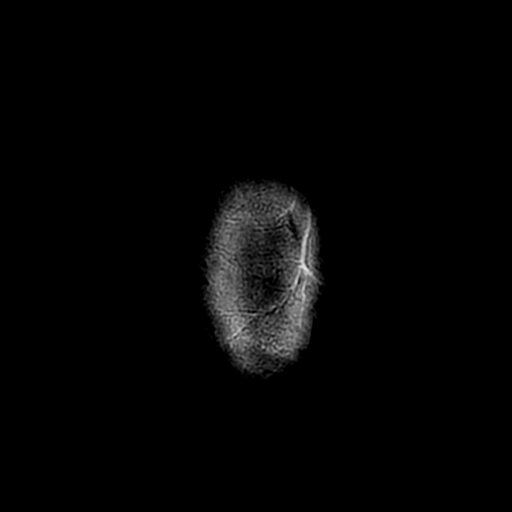

[Series 11: T2 post-contrast · coronal · 5.0mm · 0.45mm/px · 2 of 27 slices shown]
[im 1/27]
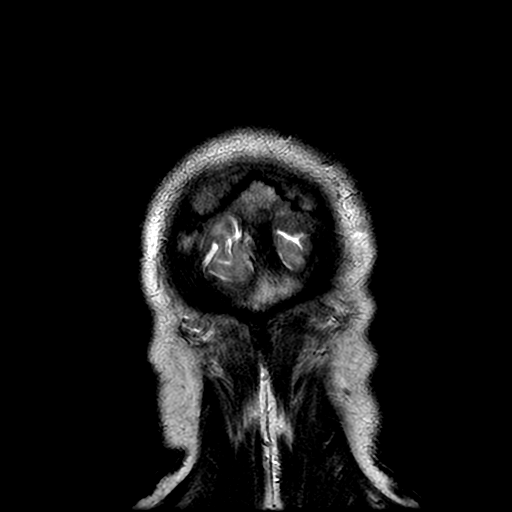
[im 27/27]
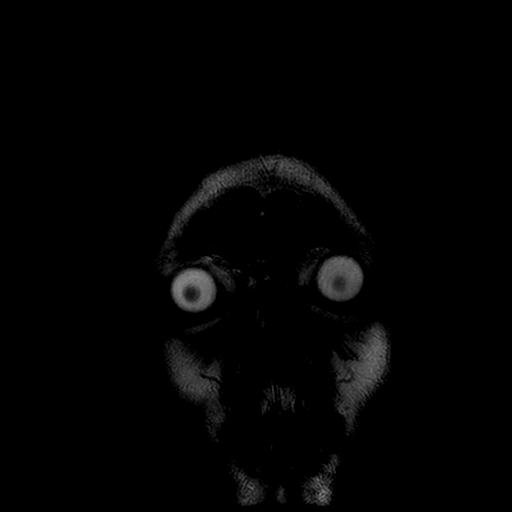

[Series 13: T1 post-contrast · coronal · 5.0mm · 0.45mm/px · 2 of 27 slices shown (1 of 2)]
[im 1/27]
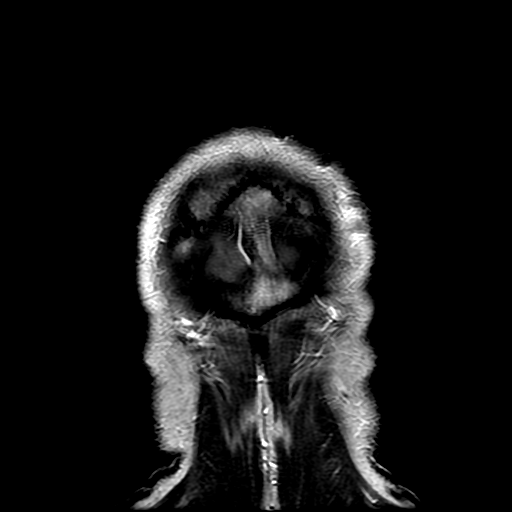
[im 27/27]
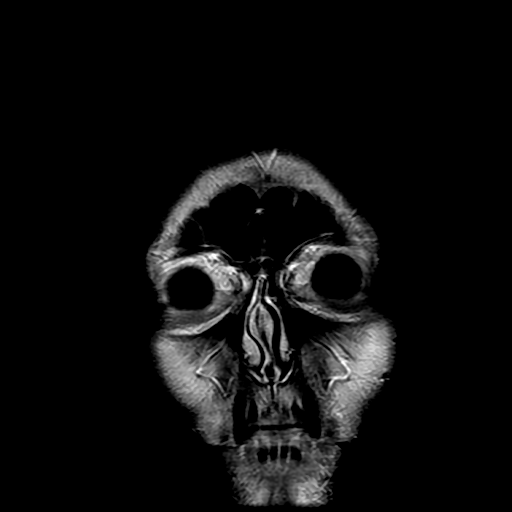

[Series 14: T1 post-contrast · sagittal · 5.0mm · 0.47mm/px · 2 of 24 slices shown (2 of 2)]
[im 1/24]
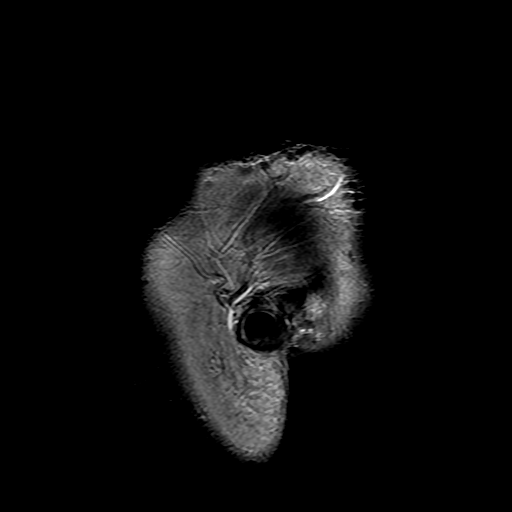
[im 24/24]
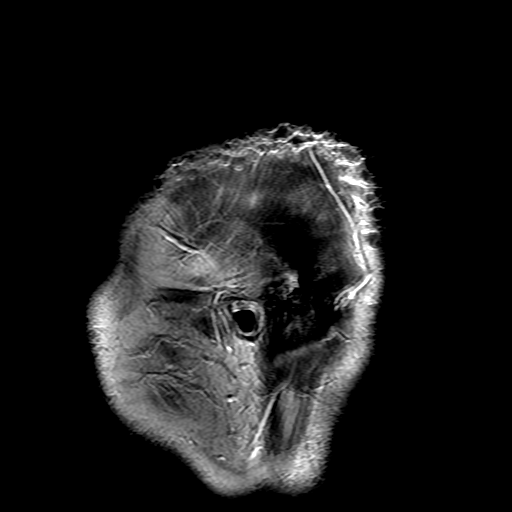

[Series 300: DWI · axial · 3.0mm · 1.09mm/px · z∈[-78,+80]mm · 4 of 55 slices shown (3 of 4)]
[im 1/55]
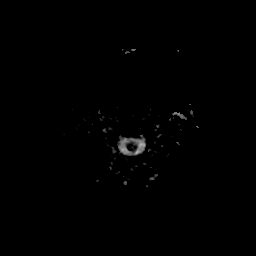
[im 19/55]
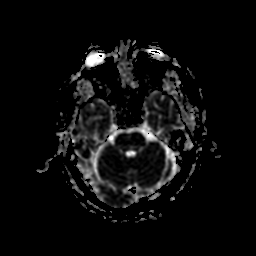
[im 37/55]
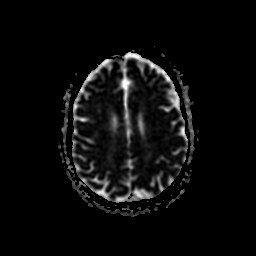
[im 55/55]
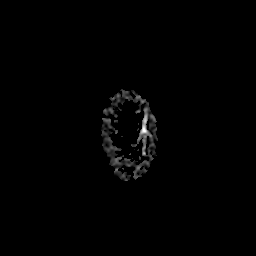

[Series 500: DWI · coronal · 4.0mm · 1.09mm/px · 3 of 35 slices shown (4 of 4)]
[im 1/35]
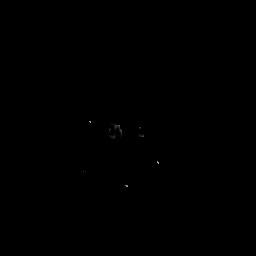
[im 18/35]
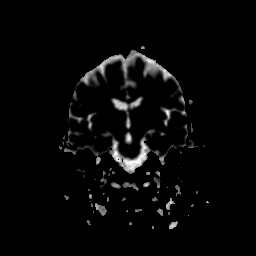
[im 35/35]
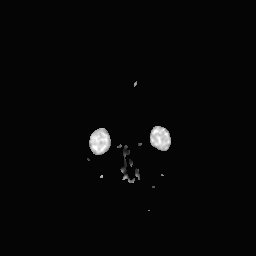

[34 of 48 positions shown; findings below may reference images not displayed]

FINDINGS: Brain: Previously noted metastatic deposits are no longer
visualized. No new lesions. No edema. Negative for hemorrhage

Patchy white matter hyperintensity is stable and may be due to
chronic microvascular ischemia. Negative for acute infarct. Negative
for hydrocephalus or midline shift.

Vascular: Normal arterial flow voids

Skull and upper cervical spine: Negative

Sinuses/Orbits: Mild mucosal edema paranasal sinuses. Bilateral
mastoid effusion left greater than right. Mild progression on the
right no change on the left.

Other: None
IMPRESSION: Previously noted brain metastasis have resolved. No residual
enhancing tumor positive identified on today's study.

Bilateral mastoid effusion with progression on the right.

## 2018-08-25 DIAGNOSIS — Z006 Encounter for examination for normal comparison and control in clinical research program: Secondary | ICD-10-CM | POA: Diagnosis not present

## 2018-08-25 DIAGNOSIS — E278 Other specified disorders of adrenal gland: Secondary | ICD-10-CM | POA: Diagnosis not present

## 2018-08-25 DIAGNOSIS — C3490 Malignant neoplasm of unspecified part of unspecified bronchus or lung: Secondary | ICD-10-CM | POA: Diagnosis not present

## 2018-08-25 DIAGNOSIS — C3491 Malignant neoplasm of unspecified part of right bronchus or lung: Secondary | ICD-10-CM | POA: Diagnosis not present

## 2018-08-25 DIAGNOSIS — R931 Abnormal findings on diagnostic imaging of heart and coronary circulation: Secondary | ICD-10-CM | POA: Diagnosis not present

## 2018-08-25 DIAGNOSIS — R590 Localized enlarged lymph nodes: Secondary | ICD-10-CM | POA: Diagnosis not present

## 2018-08-26 DIAGNOSIS — I313 Pericardial effusion (noninflammatory): Secondary | ICD-10-CM | POA: Diagnosis not present

## 2018-08-26 DIAGNOSIS — C3490 Malignant neoplasm of unspecified part of unspecified bronchus or lung: Secondary | ICD-10-CM | POA: Diagnosis not present

## 2018-08-26 DIAGNOSIS — I081 Rheumatic disorders of both mitral and tricuspid valves: Secondary | ICD-10-CM | POA: Diagnosis not present

## 2018-09-01 DIAGNOSIS — Z87891 Personal history of nicotine dependence: Secondary | ICD-10-CM | POA: Diagnosis not present

## 2018-09-01 DIAGNOSIS — C3490 Malignant neoplasm of unspecified part of unspecified bronchus or lung: Secondary | ICD-10-CM | POA: Diagnosis not present

## 2018-09-01 DIAGNOSIS — H9190 Unspecified hearing loss, unspecified ear: Secondary | ICD-10-CM | POA: Diagnosis not present

## 2018-09-01 DIAGNOSIS — C3411 Malignant neoplasm of upper lobe, right bronchus or lung: Secondary | ICD-10-CM | POA: Diagnosis not present

## 2018-09-01 DIAGNOSIS — C7931 Secondary malignant neoplasm of brain: Secondary | ICD-10-CM | POA: Diagnosis not present

## 2018-09-01 DIAGNOSIS — R5383 Other fatigue: Secondary | ICD-10-CM | POA: Diagnosis not present

## 2018-09-01 DIAGNOSIS — Z8521 Personal history of malignant neoplasm of larynx: Secondary | ICD-10-CM | POA: Diagnosis not present

## 2018-09-01 DIAGNOSIS — E279 Disorder of adrenal gland, unspecified: Secondary | ICD-10-CM | POA: Diagnosis not present

## 2018-09-01 DIAGNOSIS — Z79899 Other long term (current) drug therapy: Secondary | ICD-10-CM | POA: Diagnosis not present

## 2018-09-11 DIAGNOSIS — E782 Mixed hyperlipidemia: Secondary | ICD-10-CM | POA: Diagnosis not present

## 2018-09-11 DIAGNOSIS — C3411 Malignant neoplasm of upper lobe, right bronchus or lung: Secondary | ICD-10-CM | POA: Diagnosis not present

## 2018-09-11 DIAGNOSIS — M109 Gout, unspecified: Secondary | ICD-10-CM | POA: Diagnosis not present

## 2018-09-11 DIAGNOSIS — I10 Essential (primary) hypertension: Secondary | ICD-10-CM | POA: Diagnosis not present

## 2018-09-15 DIAGNOSIS — Z87891 Personal history of nicotine dependence: Secondary | ICD-10-CM | POA: Diagnosis not present

## 2018-09-15 DIAGNOSIS — C3411 Malignant neoplasm of upper lobe, right bronchus or lung: Secondary | ICD-10-CM | POA: Diagnosis not present

## 2018-09-15 DIAGNOSIS — C7931 Secondary malignant neoplasm of brain: Secondary | ICD-10-CM | POA: Diagnosis not present

## 2018-09-15 DIAGNOSIS — C3491 Malignant neoplasm of unspecified part of right bronchus or lung: Secondary | ICD-10-CM | POA: Diagnosis not present

## 2018-09-15 DIAGNOSIS — Z79899 Other long term (current) drug therapy: Secondary | ICD-10-CM | POA: Diagnosis not present

## 2018-09-15 DIAGNOSIS — C7972 Secondary malignant neoplasm of left adrenal gland: Secondary | ICD-10-CM | POA: Diagnosis not present

## 2018-09-22 DIAGNOSIS — C3491 Malignant neoplasm of unspecified part of right bronchus or lung: Secondary | ICD-10-CM | POA: Diagnosis not present

## 2018-09-23 DIAGNOSIS — C7972 Secondary malignant neoplasm of left adrenal gland: Secondary | ICD-10-CM | POA: Diagnosis not present

## 2018-09-23 DIAGNOSIS — C3411 Malignant neoplasm of upper lobe, right bronchus or lung: Secondary | ICD-10-CM | POA: Diagnosis not present

## 2018-09-23 DIAGNOSIS — C7931 Secondary malignant neoplasm of brain: Secondary | ICD-10-CM | POA: Diagnosis not present

## 2018-09-29 DIAGNOSIS — C3491 Malignant neoplasm of unspecified part of right bronchus or lung: Secondary | ICD-10-CM | POA: Diagnosis not present

## 2018-09-29 DIAGNOSIS — Z87891 Personal history of nicotine dependence: Secondary | ICD-10-CM | POA: Diagnosis not present

## 2018-10-06 DIAGNOSIS — Z87891 Personal history of nicotine dependence: Secondary | ICD-10-CM | POA: Diagnosis not present

## 2018-10-06 DIAGNOSIS — C7931 Secondary malignant neoplasm of brain: Secondary | ICD-10-CM | POA: Diagnosis not present

## 2018-10-06 DIAGNOSIS — C3491 Malignant neoplasm of unspecified part of right bronchus or lung: Secondary | ICD-10-CM | POA: Diagnosis not present

## 2018-10-06 DIAGNOSIS — C3411 Malignant neoplasm of upper lobe, right bronchus or lung: Secondary | ICD-10-CM | POA: Diagnosis not present

## 2018-10-06 DIAGNOSIS — Z5111 Encounter for antineoplastic chemotherapy: Secondary | ICD-10-CM | POA: Diagnosis not present

## 2018-10-13 DIAGNOSIS — J90 Pleural effusion, not elsewhere classified: Secondary | ICD-10-CM | POA: Diagnosis not present

## 2018-10-13 DIAGNOSIS — R918 Other nonspecific abnormal finding of lung field: Secondary | ICD-10-CM | POA: Diagnosis not present

## 2018-10-13 DIAGNOSIS — Z923 Personal history of irradiation: Secondary | ICD-10-CM | POA: Diagnosis not present

## 2018-10-13 DIAGNOSIS — C3411 Malignant neoplasm of upper lobe, right bronchus or lung: Secondary | ICD-10-CM | POA: Diagnosis not present

## 2018-10-13 DIAGNOSIS — C3490 Malignant neoplasm of unspecified part of unspecified bronchus or lung: Secondary | ICD-10-CM | POA: Diagnosis not present

## 2018-10-13 DIAGNOSIS — C3491 Malignant neoplasm of unspecified part of right bronchus or lung: Secondary | ICD-10-CM | POA: Diagnosis not present

## 2018-10-30 DIAGNOSIS — Z79899 Other long term (current) drug therapy: Secondary | ICD-10-CM | POA: Diagnosis not present

## 2018-10-30 DIAGNOSIS — Z8521 Personal history of malignant neoplasm of larynx: Secondary | ICD-10-CM | POA: Diagnosis not present

## 2018-10-30 DIAGNOSIS — C7801 Secondary malignant neoplasm of right lung: Secondary | ICD-10-CM | POA: Diagnosis not present

## 2018-10-30 DIAGNOSIS — C3490 Malignant neoplasm of unspecified part of unspecified bronchus or lung: Secondary | ICD-10-CM | POA: Diagnosis not present

## 2018-10-30 DIAGNOSIS — Z87891 Personal history of nicotine dependence: Secondary | ICD-10-CM | POA: Diagnosis not present

## 2018-11-04 DIAGNOSIS — Z79899 Other long term (current) drug therapy: Secondary | ICD-10-CM | POA: Diagnosis not present

## 2018-11-04 DIAGNOSIS — C7931 Secondary malignant neoplasm of brain: Secondary | ICD-10-CM | POA: Diagnosis not present

## 2018-11-04 DIAGNOSIS — C3411 Malignant neoplasm of upper lobe, right bronchus or lung: Secondary | ICD-10-CM | POA: Diagnosis not present

## 2018-11-04 DIAGNOSIS — Z8521 Personal history of malignant neoplasm of larynx: Secondary | ICD-10-CM | POA: Diagnosis not present

## 2018-11-04 DIAGNOSIS — C3491 Malignant neoplasm of unspecified part of right bronchus or lung: Secondary | ICD-10-CM | POA: Diagnosis not present

## 2018-11-24 DIAGNOSIS — C3411 Malignant neoplasm of upper lobe, right bronchus or lung: Secondary | ICD-10-CM | POA: Diagnosis not present

## 2018-11-24 DIAGNOSIS — Z87891 Personal history of nicotine dependence: Secondary | ICD-10-CM | POA: Diagnosis not present

## 2018-11-24 DIAGNOSIS — Z23 Encounter for immunization: Secondary | ICD-10-CM | POA: Diagnosis not present

## 2018-11-24 DIAGNOSIS — C7931 Secondary malignant neoplasm of brain: Secondary | ICD-10-CM | POA: Diagnosis not present

## 2018-11-24 DIAGNOSIS — Z8521 Personal history of malignant neoplasm of larynx: Secondary | ICD-10-CM | POA: Diagnosis not present

## 2018-11-24 DIAGNOSIS — C3490 Malignant neoplasm of unspecified part of unspecified bronchus or lung: Secondary | ICD-10-CM | POA: Diagnosis not present

## 2018-11-24 DIAGNOSIS — Z923 Personal history of irradiation: Secondary | ICD-10-CM | POA: Diagnosis not present

## 2018-12-15 DIAGNOSIS — C3491 Malignant neoplasm of unspecified part of right bronchus or lung: Secondary | ICD-10-CM | POA: Diagnosis not present

## 2018-12-15 DIAGNOSIS — C7931 Secondary malignant neoplasm of brain: Secondary | ICD-10-CM | POA: Diagnosis not present

## 2018-12-15 DIAGNOSIS — C3490 Malignant neoplasm of unspecified part of unspecified bronchus or lung: Secondary | ICD-10-CM | POA: Diagnosis not present

## 2018-12-15 DIAGNOSIS — C3411 Malignant neoplasm of upper lobe, right bronchus or lung: Secondary | ICD-10-CM | POA: Diagnosis not present

## 2018-12-15 DIAGNOSIS — R590 Localized enlarged lymph nodes: Secondary | ICD-10-CM | POA: Diagnosis not present

## 2018-12-22 DIAGNOSIS — R0601 Orthopnea: Secondary | ICD-10-CM | POA: Diagnosis not present

## 2018-12-22 DIAGNOSIS — H9190 Unspecified hearing loss, unspecified ear: Secondary | ICD-10-CM | POA: Diagnosis not present

## 2018-12-22 DIAGNOSIS — C3411 Malignant neoplasm of upper lobe, right bronchus or lung: Secondary | ICD-10-CM | POA: Diagnosis not present

## 2018-12-22 DIAGNOSIS — R590 Localized enlarged lymph nodes: Secondary | ICD-10-CM | POA: Diagnosis not present

## 2018-12-22 DIAGNOSIS — R0602 Shortness of breath: Secondary | ICD-10-CM | POA: Diagnosis not present

## 2018-12-22 DIAGNOSIS — E279 Disorder of adrenal gland, unspecified: Secondary | ICD-10-CM | POA: Diagnosis not present

## 2018-12-22 DIAGNOSIS — C7931 Secondary malignant neoplasm of brain: Secondary | ICD-10-CM | POA: Diagnosis not present

## 2018-12-22 DIAGNOSIS — Z79899 Other long term (current) drug therapy: Secondary | ICD-10-CM | POA: Diagnosis not present

## 2018-12-22 DIAGNOSIS — J90 Pleural effusion, not elsewhere classified: Secondary | ICD-10-CM | POA: Diagnosis not present

## 2018-12-22 DIAGNOSIS — K5900 Constipation, unspecified: Secondary | ICD-10-CM | POA: Diagnosis not present

## 2018-12-22 DIAGNOSIS — R5383 Other fatigue: Secondary | ICD-10-CM | POA: Diagnosis not present

## 2018-12-22 DIAGNOSIS — Z8521 Personal history of malignant neoplasm of larynx: Secondary | ICD-10-CM | POA: Diagnosis not present

## 2018-12-23 DIAGNOSIS — Z5111 Encounter for antineoplastic chemotherapy: Secondary | ICD-10-CM | POA: Diagnosis not present

## 2018-12-23 DIAGNOSIS — C7931 Secondary malignant neoplasm of brain: Secondary | ICD-10-CM | POA: Diagnosis not present

## 2018-12-23 DIAGNOSIS — Z87891 Personal history of nicotine dependence: Secondary | ICD-10-CM | POA: Diagnosis not present

## 2018-12-23 DIAGNOSIS — C3411 Malignant neoplasm of upper lobe, right bronchus or lung: Secondary | ICD-10-CM | POA: Diagnosis not present

## 2018-12-23 DIAGNOSIS — Z8521 Personal history of malignant neoplasm of larynx: Secondary | ICD-10-CM | POA: Diagnosis not present

## 2018-12-24 DIAGNOSIS — Z87891 Personal history of nicotine dependence: Secondary | ICD-10-CM | POA: Diagnosis not present

## 2018-12-24 DIAGNOSIS — Z5111 Encounter for antineoplastic chemotherapy: Secondary | ICD-10-CM | POA: Diagnosis not present

## 2018-12-24 DIAGNOSIS — C7931 Secondary malignant neoplasm of brain: Secondary | ICD-10-CM | POA: Diagnosis not present

## 2018-12-24 DIAGNOSIS — C3411 Malignant neoplasm of upper lobe, right bronchus or lung: Secondary | ICD-10-CM | POA: Diagnosis not present

## 2018-12-25 DIAGNOSIS — Z8521 Personal history of malignant neoplasm of larynx: Secondary | ICD-10-CM | POA: Diagnosis not present

## 2018-12-25 DIAGNOSIS — C7931 Secondary malignant neoplasm of brain: Secondary | ICD-10-CM | POA: Diagnosis not present

## 2018-12-25 DIAGNOSIS — C3411 Malignant neoplasm of upper lobe, right bronchus or lung: Secondary | ICD-10-CM | POA: Diagnosis not present

## 2018-12-25 DIAGNOSIS — Z7689 Persons encountering health services in other specified circumstances: Secondary | ICD-10-CM | POA: Diagnosis not present

## 2018-12-25 DIAGNOSIS — Z87891 Personal history of nicotine dependence: Secondary | ICD-10-CM | POA: Diagnosis not present

## 2018-12-25 DIAGNOSIS — Z923 Personal history of irradiation: Secondary | ICD-10-CM | POA: Diagnosis not present

## 2018-12-27 DIAGNOSIS — C3490 Malignant neoplasm of unspecified part of unspecified bronchus or lung: Secondary | ICD-10-CM | POA: Diagnosis not present

## 2019-01-12 DIAGNOSIS — Z8521 Personal history of malignant neoplasm of larynx: Secondary | ICD-10-CM | POA: Diagnosis not present

## 2019-01-12 DIAGNOSIS — Z79899 Other long term (current) drug therapy: Secondary | ICD-10-CM | POA: Diagnosis not present

## 2019-01-12 DIAGNOSIS — C3411 Malignant neoplasm of upper lobe, right bronchus or lung: Secondary | ICD-10-CM | POA: Diagnosis not present

## 2019-01-12 DIAGNOSIS — C3490 Malignant neoplasm of unspecified part of unspecified bronchus or lung: Secondary | ICD-10-CM | POA: Diagnosis not present

## 2019-01-12 DIAGNOSIS — C7931 Secondary malignant neoplasm of brain: Secondary | ICD-10-CM | POA: Diagnosis not present

## 2019-01-13 DIAGNOSIS — Z79899 Other long term (current) drug therapy: Secondary | ICD-10-CM | POA: Diagnosis not present

## 2019-01-13 DIAGNOSIS — Z5111 Encounter for antineoplastic chemotherapy: Secondary | ICD-10-CM | POA: Diagnosis not present

## 2019-01-13 DIAGNOSIS — C7931 Secondary malignant neoplasm of brain: Secondary | ICD-10-CM | POA: Diagnosis not present

## 2019-01-13 DIAGNOSIS — R0601 Orthopnea: Secondary | ICD-10-CM | POA: Diagnosis not present

## 2019-01-13 DIAGNOSIS — C3411 Malignant neoplasm of upper lobe, right bronchus or lung: Secondary | ICD-10-CM | POA: Diagnosis not present

## 2019-01-14 DIAGNOSIS — Z79899 Other long term (current) drug therapy: Secondary | ICD-10-CM | POA: Diagnosis not present

## 2019-01-14 DIAGNOSIS — Z5111 Encounter for antineoplastic chemotherapy: Secondary | ICD-10-CM | POA: Diagnosis not present

## 2019-01-14 DIAGNOSIS — C7931 Secondary malignant neoplasm of brain: Secondary | ICD-10-CM | POA: Diagnosis not present

## 2019-01-14 DIAGNOSIS — C3411 Malignant neoplasm of upper lobe, right bronchus or lung: Secondary | ICD-10-CM | POA: Diagnosis not present

## 2019-01-16 DIAGNOSIS — C7931 Secondary malignant neoplasm of brain: Secondary | ICD-10-CM | POA: Diagnosis not present

## 2019-01-16 DIAGNOSIS — Z7689 Persons encountering health services in other specified circumstances: Secondary | ICD-10-CM | POA: Diagnosis not present

## 2019-01-16 DIAGNOSIS — C3411 Malignant neoplasm of upper lobe, right bronchus or lung: Secondary | ICD-10-CM | POA: Diagnosis not present

## 2019-02-02 DIAGNOSIS — M542 Cervicalgia: Secondary | ICD-10-CM | POA: Diagnosis not present

## 2019-02-02 DIAGNOSIS — Z79899 Other long term (current) drug therapy: Secondary | ICD-10-CM | POA: Diagnosis not present

## 2019-02-02 DIAGNOSIS — Z8521 Personal history of malignant neoplasm of larynx: Secondary | ICD-10-CM | POA: Diagnosis not present

## 2019-02-02 DIAGNOSIS — R531 Weakness: Secondary | ICD-10-CM | POA: Diagnosis not present

## 2019-02-02 DIAGNOSIS — R0602 Shortness of breath: Secondary | ICD-10-CM | POA: Diagnosis not present

## 2019-02-02 DIAGNOSIS — C3411 Malignant neoplasm of upper lobe, right bronchus or lung: Secondary | ICD-10-CM | POA: Diagnosis not present

## 2019-02-02 DIAGNOSIS — D649 Anemia, unspecified: Secondary | ICD-10-CM | POA: Diagnosis not present

## 2019-02-02 DIAGNOSIS — C3491 Malignant neoplasm of unspecified part of right bronchus or lung: Secondary | ICD-10-CM | POA: Diagnosis not present

## 2019-02-02 DIAGNOSIS — C7931 Secondary malignant neoplasm of brain: Secondary | ICD-10-CM | POA: Diagnosis not present

## 2019-02-10 DIAGNOSIS — R590 Localized enlarged lymph nodes: Secondary | ICD-10-CM | POA: Diagnosis not present

## 2019-02-10 DIAGNOSIS — C3491 Malignant neoplasm of unspecified part of right bronchus or lung: Secondary | ICD-10-CM | POA: Diagnosis not present

## 2019-02-10 DIAGNOSIS — D630 Anemia in neoplastic disease: Secondary | ICD-10-CM | POA: Diagnosis not present

## 2019-02-10 DIAGNOSIS — D649 Anemia, unspecified: Secondary | ICD-10-CM | POA: Diagnosis not present

## 2019-02-26 DIAGNOSIS — C3490 Malignant neoplasm of unspecified part of unspecified bronchus or lung: Secondary | ICD-10-CM | POA: Diagnosis not present

## 2019-02-26 DIAGNOSIS — C3411 Malignant neoplasm of upper lobe, right bronchus or lung: Secondary | ICD-10-CM | POA: Diagnosis not present

## 2019-03-02 DIAGNOSIS — D649 Anemia, unspecified: Secondary | ICD-10-CM | POA: Diagnosis not present

## 2019-03-02 DIAGNOSIS — C7931 Secondary malignant neoplasm of brain: Secondary | ICD-10-CM | POA: Diagnosis not present

## 2019-03-02 DIAGNOSIS — S0101XA Laceration without foreign body of scalp, initial encounter: Secondary | ICD-10-CM | POA: Diagnosis not present

## 2019-03-02 DIAGNOSIS — C3490 Malignant neoplasm of unspecified part of unspecified bronchus or lung: Secondary | ICD-10-CM | POA: Diagnosis not present

## 2019-03-02 DIAGNOSIS — C3491 Malignant neoplasm of unspecified part of right bronchus or lung: Secondary | ICD-10-CM | POA: Diagnosis not present

## 2019-03-02 DIAGNOSIS — X58XXXA Exposure to other specified factors, initial encounter: Secondary | ICD-10-CM | POA: Diagnosis not present

## 2019-03-02 DIAGNOSIS — S0001XA Abrasion of scalp, initial encounter: Secondary | ICD-10-CM | POA: Diagnosis not present

## 2019-03-11 DIAGNOSIS — Z79899 Other long term (current) drug therapy: Secondary | ICD-10-CM | POA: Diagnosis not present

## 2019-03-11 DIAGNOSIS — Z5111 Encounter for antineoplastic chemotherapy: Secondary | ICD-10-CM | POA: Diagnosis not present

## 2019-03-11 DIAGNOSIS — C7931 Secondary malignant neoplasm of brain: Secondary | ICD-10-CM | POA: Diagnosis not present

## 2019-03-11 DIAGNOSIS — C3411 Malignant neoplasm of upper lobe, right bronchus or lung: Secondary | ICD-10-CM | POA: Diagnosis not present

## 2019-03-30 DIAGNOSIS — Z87891 Personal history of nicotine dependence: Secondary | ICD-10-CM | POA: Diagnosis not present

## 2019-03-30 DIAGNOSIS — Z5111 Encounter for antineoplastic chemotherapy: Secondary | ICD-10-CM | POA: Diagnosis not present

## 2019-03-30 DIAGNOSIS — C3490 Malignant neoplasm of unspecified part of unspecified bronchus or lung: Secondary | ICD-10-CM | POA: Diagnosis not present

## 2019-03-30 DIAGNOSIS — K5900 Constipation, unspecified: Secondary | ICD-10-CM | POA: Diagnosis not present

## 2019-03-30 DIAGNOSIS — C7931 Secondary malignant neoplasm of brain: Secondary | ICD-10-CM | POA: Diagnosis not present

## 2019-03-30 DIAGNOSIS — R140 Abdominal distension (gaseous): Secondary | ICD-10-CM | POA: Diagnosis not present

## 2019-03-30 DIAGNOSIS — C3411 Malignant neoplasm of upper lobe, right bronchus or lung: Secondary | ICD-10-CM | POA: Diagnosis not present

## 2019-03-30 DIAGNOSIS — Z8521 Personal history of malignant neoplasm of larynx: Secondary | ICD-10-CM | POA: Diagnosis not present

## 2019-04-20 DIAGNOSIS — R590 Localized enlarged lymph nodes: Secondary | ICD-10-CM | POA: Diagnosis not present

## 2019-04-20 DIAGNOSIS — K859 Acute pancreatitis without necrosis or infection, unspecified: Secondary | ICD-10-CM | POA: Diagnosis not present

## 2019-04-20 DIAGNOSIS — C3490 Malignant neoplasm of unspecified part of unspecified bronchus or lung: Secondary | ICD-10-CM | POA: Diagnosis not present

## 2019-04-20 DIAGNOSIS — R1032 Left lower quadrant pain: Secondary | ICD-10-CM | POA: Diagnosis not present

## 2019-04-20 DIAGNOSIS — C3491 Malignant neoplasm of unspecified part of right bronchus or lung: Secondary | ICD-10-CM | POA: Diagnosis not present

## 2019-04-20 DIAGNOSIS — M549 Dorsalgia, unspecified: Secondary | ICD-10-CM | POA: Diagnosis not present

## 2019-04-27 DIAGNOSIS — Z5111 Encounter for antineoplastic chemotherapy: Secondary | ICD-10-CM | POA: Diagnosis not present

## 2019-04-27 DIAGNOSIS — Z8521 Personal history of malignant neoplasm of larynx: Secondary | ICD-10-CM | POA: Diagnosis not present

## 2019-04-27 DIAGNOSIS — R600 Localized edema: Secondary | ICD-10-CM | POA: Diagnosis not present

## 2019-04-27 DIAGNOSIS — C3411 Malignant neoplasm of upper lobe, right bronchus or lung: Secondary | ICD-10-CM | POA: Diagnosis not present

## 2019-04-27 DIAGNOSIS — T451X5A Adverse effect of antineoplastic and immunosuppressive drugs, initial encounter: Secondary | ICD-10-CM | POA: Diagnosis not present

## 2019-04-27 DIAGNOSIS — Z87891 Personal history of nicotine dependence: Secondary | ICD-10-CM | POA: Diagnosis not present

## 2019-04-27 DIAGNOSIS — C7951 Secondary malignant neoplasm of bone: Secondary | ICD-10-CM | POA: Diagnosis not present

## 2019-05-02 DIAGNOSIS — C7972 Secondary malignant neoplasm of left adrenal gland: Secondary | ICD-10-CM | POA: Diagnosis not present

## 2019-05-02 DIAGNOSIS — C7970 Secondary malignant neoplasm of unspecified adrenal gland: Secondary | ICD-10-CM | POA: Diagnosis not present

## 2019-05-02 DIAGNOSIS — C329 Malignant neoplasm of larynx, unspecified: Secondary | ICD-10-CM | POA: Diagnosis not present

## 2019-05-02 DIAGNOSIS — Z85118 Personal history of other malignant neoplasm of bronchus and lung: Secondary | ICD-10-CM | POA: Diagnosis not present

## 2019-05-02 DIAGNOSIS — R0602 Shortness of breath: Secondary | ICD-10-CM | POA: Diagnosis not present

## 2019-05-02 DIAGNOSIS — Z96649 Presence of unspecified artificial hip joint: Secondary | ICD-10-CM | POA: Diagnosis not present

## 2019-05-02 DIAGNOSIS — R5081 Fever presenting with conditions classified elsewhere: Secondary | ICD-10-CM | POA: Diagnosis not present

## 2019-05-02 DIAGNOSIS — Z79899 Other long term (current) drug therapy: Secondary | ICD-10-CM | POA: Diagnosis not present

## 2019-05-02 DIAGNOSIS — Z8521 Personal history of malignant neoplasm of larynx: Secondary | ICD-10-CM | POA: Diagnosis not present

## 2019-05-02 DIAGNOSIS — R911 Solitary pulmonary nodule: Secondary | ICD-10-CM | POA: Diagnosis not present

## 2019-05-02 DIAGNOSIS — Z807 Family history of other malignant neoplasms of lymphoid, hematopoietic and related tissues: Secondary | ICD-10-CM | POA: Diagnosis not present

## 2019-05-02 DIAGNOSIS — Z85819 Personal history of malignant neoplasm of unspecified site of lip, oral cavity, and pharynx: Secondary | ICD-10-CM | POA: Diagnosis not present

## 2019-05-02 DIAGNOSIS — I10 Essential (primary) hypertension: Secondary | ICD-10-CM | POA: Diagnosis not present

## 2019-05-02 DIAGNOSIS — R000 Tachycardia, unspecified: Secondary | ICD-10-CM | POA: Diagnosis not present

## 2019-05-02 DIAGNOSIS — Z20822 Contact with and (suspected) exposure to covid-19: Secondary | ICD-10-CM | POA: Diagnosis not present

## 2019-05-02 DIAGNOSIS — R509 Fever, unspecified: Secondary | ICD-10-CM | POA: Diagnosis not present

## 2019-05-02 DIAGNOSIS — C3490 Malignant neoplasm of unspecified part of unspecified bronchus or lung: Secondary | ICD-10-CM | POA: Diagnosis not present

## 2019-05-02 DIAGNOSIS — R109 Unspecified abdominal pain: Secondary | ICD-10-CM | POA: Diagnosis not present

## 2019-05-02 DIAGNOSIS — Z87891 Personal history of nicotine dependence: Secondary | ICD-10-CM | POA: Diagnosis not present

## 2019-05-02 DIAGNOSIS — G9389 Other specified disorders of brain: Secondary | ICD-10-CM | POA: Diagnosis not present

## 2019-05-02 DIAGNOSIS — R918 Other nonspecific abnormal finding of lung field: Secondary | ICD-10-CM | POA: Diagnosis not present

## 2019-05-02 DIAGNOSIS — K5900 Constipation, unspecified: Secondary | ICD-10-CM | POA: Diagnosis not present

## 2019-05-02 DIAGNOSIS — C7931 Secondary malignant neoplasm of brain: Secondary | ICD-10-CM | POA: Diagnosis not present

## 2019-05-02 DIAGNOSIS — J029 Acute pharyngitis, unspecified: Secondary | ICD-10-CM | POA: Diagnosis not present

## 2019-05-02 DIAGNOSIS — D709 Neutropenia, unspecified: Secondary | ICD-10-CM | POA: Diagnosis not present

## 2019-05-03 DIAGNOSIS — C7972 Secondary malignant neoplasm of left adrenal gland: Secondary | ICD-10-CM | POA: Diagnosis not present

## 2019-05-03 DIAGNOSIS — C7931 Secondary malignant neoplasm of brain: Secondary | ICD-10-CM | POA: Diagnosis not present

## 2019-05-03 DIAGNOSIS — C3490 Malignant neoplasm of unspecified part of unspecified bronchus or lung: Secondary | ICD-10-CM | POA: Diagnosis not present

## 2019-05-03 DIAGNOSIS — I10 Essential (primary) hypertension: Secondary | ICD-10-CM | POA: Diagnosis not present

## 2019-05-03 DIAGNOSIS — R000 Tachycardia, unspecified: Secondary | ICD-10-CM | POA: Diagnosis not present

## 2019-05-04 DIAGNOSIS — K1230 Oral mucositis (ulcerative), unspecified: Secondary | ICD-10-CM | POA: Diagnosis not present

## 2019-05-04 DIAGNOSIS — R9089 Other abnormal findings on diagnostic imaging of central nervous system: Secondary | ICD-10-CM | POA: Diagnosis not present

## 2019-05-04 DIAGNOSIS — Z85118 Personal history of other malignant neoplasm of bronchus and lung: Secondary | ICD-10-CM | POA: Diagnosis not present

## 2019-05-04 DIAGNOSIS — Z8521 Personal history of malignant neoplasm of larynx: Secondary | ICD-10-CM | POA: Diagnosis not present

## 2019-05-14 DIAGNOSIS — C7931 Secondary malignant neoplasm of brain: Secondary | ICD-10-CM | POA: Diagnosis not present

## 2019-05-14 DIAGNOSIS — C3481 Malignant neoplasm of overlapping sites of right bronchus and lung: Secondary | ICD-10-CM | POA: Diagnosis not present

## 2019-05-18 DIAGNOSIS — C3490 Malignant neoplasm of unspecified part of unspecified bronchus or lung: Secondary | ICD-10-CM | POA: Diagnosis not present

## 2019-05-18 DIAGNOSIS — R5383 Other fatigue: Secondary | ICD-10-CM | POA: Diagnosis not present

## 2019-05-18 DIAGNOSIS — C7931 Secondary malignant neoplasm of brain: Secondary | ICD-10-CM | POA: Diagnosis not present

## 2019-05-18 DIAGNOSIS — K1230 Oral mucositis (ulcerative), unspecified: Secondary | ICD-10-CM | POA: Diagnosis not present

## 2019-05-18 DIAGNOSIS — R9089 Other abnormal findings on diagnostic imaging of central nervous system: Secondary | ICD-10-CM | POA: Diagnosis not present

## 2019-05-19 DIAGNOSIS — C7931 Secondary malignant neoplasm of brain: Secondary | ICD-10-CM | POA: Diagnosis not present

## 2019-05-19 DIAGNOSIS — C3490 Malignant neoplasm of unspecified part of unspecified bronchus or lung: Secondary | ICD-10-CM | POA: Diagnosis not present

## 2019-05-20 ENCOUNTER — Ambulatory Visit: Payer: BC Managed Care – PPO

## 2019-05-20 ENCOUNTER — Ambulatory Visit: Payer: BC Managed Care – PPO | Admitting: Radiation Oncology

## 2019-05-21 DIAGNOSIS — C3411 Malignant neoplasm of upper lobe, right bronchus or lung: Secondary | ICD-10-CM | POA: Diagnosis not present

## 2019-05-21 DIAGNOSIS — C7931 Secondary malignant neoplasm of brain: Secondary | ICD-10-CM | POA: Diagnosis not present

## 2019-05-21 DIAGNOSIS — Z5111 Encounter for antineoplastic chemotherapy: Secondary | ICD-10-CM | POA: Diagnosis not present

## 2019-05-21 DIAGNOSIS — Z79899 Other long term (current) drug therapy: Secondary | ICD-10-CM | POA: Diagnosis not present

## 2019-06-08 DIAGNOSIS — J701 Chronic and other pulmonary manifestations due to radiation: Secondary | ICD-10-CM | POA: Diagnosis not present

## 2019-06-08 DIAGNOSIS — R1013 Epigastric pain: Secondary | ICD-10-CM | POA: Diagnosis not present

## 2019-06-08 DIAGNOSIS — R590 Localized enlarged lymph nodes: Secondary | ICD-10-CM | POA: Diagnosis not present

## 2019-06-08 DIAGNOSIS — Y842 Radiological procedure and radiotherapy as the cause of abnormal reaction of the patient, or of later complication, without mention of misadventure at the time of the procedure: Secondary | ICD-10-CM | POA: Diagnosis not present

## 2019-06-08 DIAGNOSIS — K869 Disease of pancreas, unspecified: Secondary | ICD-10-CM | POA: Diagnosis not present

## 2019-06-08 DIAGNOSIS — M549 Dorsalgia, unspecified: Secondary | ICD-10-CM | POA: Diagnosis not present

## 2019-06-08 DIAGNOSIS — C3411 Malignant neoplasm of upper lobe, right bronchus or lung: Secondary | ICD-10-CM | POA: Diagnosis not present

## 2019-06-08 DIAGNOSIS — C7931 Secondary malignant neoplasm of brain: Secondary | ICD-10-CM | POA: Diagnosis not present

## 2019-06-08 DIAGNOSIS — C3490 Malignant neoplasm of unspecified part of unspecified bronchus or lung: Secondary | ICD-10-CM | POA: Diagnosis not present

## 2019-06-08 DIAGNOSIS — M7989 Other specified soft tissue disorders: Secondary | ICD-10-CM | POA: Diagnosis not present

## 2019-06-09 ENCOUNTER — Ambulatory Visit
Admission: RE | Admit: 2019-06-09 | Discharge: 2019-06-09 | Disposition: A | Discharge status: Home / Self Care | Payer: BC Managed Care – PPO | Source: Ambulatory Visit | Attending: Radiation Oncology | Admitting: Radiation Oncology

## 2019-06-09 ENCOUNTER — Ambulatory Visit: Payer: BC Managed Care – PPO

## 2019-06-09 ENCOUNTER — Other Ambulatory Visit: Payer: Self-pay

## 2019-06-09 ENCOUNTER — Encounter: Payer: Self-pay | Admitting: Radiation Oncology

## 2019-06-09 DIAGNOSIS — C772 Secondary and unspecified malignant neoplasm of intra-abdominal lymph nodes: Secondary | ICD-10-CM | POA: Diagnosis not present

## 2019-06-09 DIAGNOSIS — R109 Unspecified abdominal pain: Secondary | ICD-10-CM | POA: Diagnosis not present

## 2019-06-09 DIAGNOSIS — Z923 Personal history of irradiation: Secondary | ICD-10-CM | POA: Insufficient documentation

## 2019-06-09 DIAGNOSIS — C7951 Secondary malignant neoplasm of bone: Secondary | ICD-10-CM | POA: Insufficient documentation

## 2019-06-09 DIAGNOSIS — Z79899 Other long term (current) drug therapy: Secondary | ICD-10-CM | POA: Diagnosis not present

## 2019-06-09 DIAGNOSIS — C7931 Secondary malignant neoplasm of brain: Secondary | ICD-10-CM | POA: Insufficient documentation

## 2019-06-09 DIAGNOSIS — Z7982 Long term (current) use of aspirin: Secondary | ICD-10-CM | POA: Insufficient documentation

## 2019-06-09 DIAGNOSIS — C7889 Secondary malignant neoplasm of other digestive organs: Secondary | ICD-10-CM | POA: Insufficient documentation

## 2019-06-09 DIAGNOSIS — K869 Disease of pancreas, unspecified: Secondary | ICD-10-CM | POA: Diagnosis not present

## 2019-06-09 DIAGNOSIS — R590 Localized enlarged lymph nodes: Secondary | ICD-10-CM | POA: Diagnosis not present

## 2019-06-09 DIAGNOSIS — C3411 Malignant neoplasm of upper lobe, right bronchus or lung: Secondary | ICD-10-CM | POA: Diagnosis not present

## 2019-06-09 DIAGNOSIS — C7972 Secondary malignant neoplasm of left adrenal gland: Secondary | ICD-10-CM | POA: Diagnosis not present

## 2019-06-09 DIAGNOSIS — K5900 Constipation, unspecified: Secondary | ICD-10-CM | POA: Diagnosis not present

## 2019-06-09 DIAGNOSIS — Z510 Encounter for antineoplastic radiation therapy: Secondary | ICD-10-CM | POA: Insufficient documentation

## 2019-06-09 MED ORDER — HYDROMORPHONE HCL 1 MG/ML IJ SOLN
1.0000 mg | Freq: Once | INTRAMUSCULAR | Status: AC
  Administered 2019-06-09: 1 mg via INTRAVENOUS
  Filled 2019-06-09: qty 1

## 2019-06-09 NOTE — Progress Notes (Signed)
Radiation Oncology         [phone] ________________________________  Name: Robert Powers MRN: [id number]  Date: 06/09/2019  DOB: August 30, 1955  Re-consultation Visit Note  CC: Maury Dus, MD  Maury Dus, MD    ICD-10-CM   1. 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  12/24/2017 - 01/03/2018: Left adrenal / 50 Gy in 5 fractions of 10 Gy (SBRT / 6X-FFF)  03/28/2017 - 04/16/2017: Brain / 35 Gy in 14 fractions  10/03/2016 - 11/14/2016: Right Lung / 60 Gy in 30 fractions  Remote history of radiation therapy to the anterior neck as part of management of his T1 laryngeal carcinoma.  Narrative: The patient returns today for a re-consultation. He is accompanied by his wife. The patient was last seen here on 01/03/2018 during the end of his most recent treatment. The plan was for the patient to return for follow-up in one month, although he did not do so. Since then, the patient has been followed by Dr. Mindi Junker, hematology and oncology at [facility]  CT of chest and abdomen on 02/24/2018 at [facility] showed a decrease in the size of the left adrenal nodule that was concerning for metastatic disease. There were similar post-radiation changes in the right lung without significant imaging evidence of locally recurrent disease. Finally, there were multiple sub-5 mm pulmonary nodules scattered about both lungs that were without significant change in size or imaging characteristics relative to prior CT imaging of the chest.  CT of abdomen/pelvis from 05/02/2019 showed a burden of retroperitoneal lymphadenopathy and similar left adrenal metastasis.  MRI of brain on 05/14/2019 showed a hemorrhagic  mass within the left cerebellar hemisphere with mild surrounding vasogenic edema, most consistent with metastasis. A second punctate focus of enhancement was seen within the right cerebellar hemisphere, suggesting an additional metastasis. There was also some mild partial effacement of the left lateral fourth ventricle without significant herniation or hydrocephalus.  CT scan of chest/abdomen/pelvis on 05/29/2019 at [facility] showed an interval increase in the overall size of the nodal conglomerate at the upper abdomen adjacent to the pancreatic body, which measured approximately 5.5. x 7.0 cm in axial dimension compared to 4.0 x 6.6 cm when measured in a similar fashion. The overall burden of retroperitoneal lymphadenopathy was also increased compared to prior with largest nodes at the left para-aortic region, the previously indexed left periaortic node measured 3.9 x 2.9 cm compared to 3.2 x 2.4 cm previously. There was also a mild associated mass effect on the left renal vein, which remained patent.  On review of systems, the patient reports left-sided pain radiating to back. He denies nausea and has had significant problems with constipation.  The patient is scheduled to undergo stereotactic radiosurgery for his recurrence within the brain at [facility] next week.  Patient is now seen in radiation oncology here to be considered for palliative treatment for his enlarging pancreatic mass and associated retroperitoneal lymphadenopathy causing back and left flank pain.   Allergies:  is allergic to aspirin; doxazosin mesylate; itraconazole; norvasc [amlodipine besylate]; tekturna [aliskiren fumarate]; zestoretic [lisinopril-hydrochlorothiazide]; and benicar [olmesartan medoxomil].  Meds: Current Outpatient Medications  Medication Sig Dispense Refill  . albuterol (PROAIR HFA) 108 (90 Base) MCG/ACT  inhaler Inhale into the lungs.    [name] allopurinol (ZYLOPRIM) 300 MG tablet Take 300 mg by mouth daily.      [name] aspirin EC 81 MG tablet Take 81 mg by mouth daily.    . carvedilol (COREG) 6.25 MG tablet TK 1 T PO BID  2  . desloratadine-pseudoephedrine (CLARINEX-D 24-HOUR) 5-240 MG per 24 hr tablet Take 1 tablet by mouth daily.      [name] Dextromethorphan-Menthol (DELSYM COUGH RELIEF MT) Use as directed in the mouth or throat.    . diltiazem (CARDIZEM CD) 180 MG 24 hr capsule Take 180 mg by mouth daily.     [name] esomeprazole (NEXIUM) 20 MG capsule Take 20 mg by mouth daily at 12 noon.    . fenofibrate (TRICOR) 145 MG tablet Take 145 mg by mouth.    . fluticasone (FLONASE) 50 MCG/ACT nasal spray U 1 SPRAY IEN QPM  5  . guaiFENesin (MUCINEX) 600 MG 12 hr tablet Take by mouth 2 (two) times daily.    . hydrochlorothiazide (HYDRODIURIL) 25 MG tablet Take 25 mg by mouth daily.      [name] HYDROcodone-homatropine (HYCODAN) 5-1.5 MG/5ML syrup Take 5 mLs by mouth every 6 (six) hours as needed for cough. 180 mL 0  . LORazepam (ATIVAN) 0.5 MG tablet Take 1 tablet (0.5 mg total) by mouth every 8 (eight) hours. 30 tablet 0  . methylPREDNISolone (MEDROL DOSEPAK) 4 MG TBPK tablet Take 4 mg by mouth.    . metoprolol succinate (TOPROL-XL) 50 MG 24 hr tablet TK 3 TS PO ONCE D  1  . metoprolol tartrate (LOPRESSOR) 25 MG tablet Take 150 mg by mouth once.     . Multiple Vitamin (MULTI-VITAMINS) TABS Take by mouth.    . potassium chloride SA (K-DUR,KLOR-CON) 20 MEQ tablet Take 1 tablet (20 mEq total) by mouth daily. 10 tablet 0  . rosuvastatin (CRESTOR) 10 MG tablet Take 5 mg by mouth daily.       No current facility-administered medications for this encounter.    Physical Findings: The patient is in no acute distress. Patient is alert and oriented.  Demonstrates some mild confusion.  Has significant pain.  Required Dilaudid prior to his simulation today  height is 6' (1.829 m) and weight is 205 lb (93 kg). His temperature is 98.7 F (37.1 C). His blood pressure is 105/73 and his pulse is 115 (abnormal). His respiration is 20  and oxygen saturation is 99%.  No significant changes. Lungs are clear to auscultation bilaterally. Heart has regular rate and rhythm. No palpable cervical, supraclavicular, or axillary adenopathy. Abdomen soft, non-tender, normal bowel sounds.   Lab Findings: Lab Results  Component Value Date   WBC 8.9 03/26/2017   HGB 13.9 03/26/2017   HCT 41.6 03/26/2017   MCV 96.5 03/26/2017   PLT 210 03/26/2017    Radiographic Findings: ABDOMEN PELVIS W CONTRAST (ROUTINE)05/02/2019 [facility] Result Impression   1. Compared to most recent CT chest abdomen pelvis, similar burden of retroperitoneal lymphadenopathy. 2. Similar left adrenal metastasis. 3. Refer to contemporaneous CT chests for detailed evaluation of findings in the thorax.  Result Narrative  CT ABDOMEN PELVIS W CONTRAST (ROUTINE), 05/02/2019 4:52 PM  INDICATION: \ R10.9 Left sided abdominal pain  COMPARISON: CT chest abdomen pelvis from 04/20/2019.  TECHNIQUE: Multislice axial images were obtained through the abdomen and pelvis with administration of iodinated intravenous contrast material. Multi-planar reformatted images were generated for additional analysis. Nongated technique limits cardiac detail.  All  CT scans at [facility] and [facility] are performed using dose optimization techniques as appropriate to a performed exam, including but not limited to one or more of the following: automated exposure control, adjustment of the mA and/or kV according to patient size, use of iterative reconstruction technique. In addition, [facility] is participating in the [place] program which will further assist [place] in optimizing patient radiation exposure.  FINDINGS:   LOWER CHEST: Refer to contemporaneous chest CT.   ABDOMEN/ PELVIS: . Liver: Hepatomegaly and hepatic steatosis. . Gallbladder/biliary: Within normal limits. . Spleen: Within normal limits. . Pancreas:  Similar hypoattenuation in the pancreatic body which likely reflects involvement by peripancreatic mass/nodal conglomerate. . Adrenals: Similar 1.5 cm left adrenal nodule.  . GI tract: Postsurgical changes from partial colectomy. No dilated loops of bowel. . Peritoneum/ mesentery/extraperitoneum: Similar nonspecific mild mesenteric soft tissue stranding. Similar size of multiple nodal metastases including: -Peripancreatic nodal conglomerate measuring approximately 2.2 x 6.8 cm (6/58), 2.2 x 7.1 cm measured in similar location on prior. -Index aortocaval node measuring approximately 2.2 x 2.3 cm (6/92), 2.2 x 2.3 cm on prior. -Index left periaortic node measuring approximately 2.7 x 2.6 cm,, 2.4 x 3.2 cm on prior (differences in size likely reflect differences in technique) - Index retrocrural node measures approximately 1.9 x 2.3 cm (6/50), 1.9 x 2.3 cm on prior.  . Kidneys: Mild bilateral perinephric stranding. Symmetric enhancement. No hydronephrosis. . Ureters: No hydroureter. . Bladder: Within normal limits.  . Reproductive system: Within normal limits.  . Vascular: Aortoiliac atherosclerosis without aneurysmal dilation.  MSK: . Bilateral hip arthroplasties.  Other Result Information  Interface, Rad Results In - 05/02/2019  6:28 PM EST Formatting of this note might be different from the original. CT ABDOMEN PELVIS W CONTRAST (ROUTINE), 05/02/2019 4:52 PM  INDICATION: \ R10.9 Left sided abdominal pain  COMPARISON: CT chest abdomen pelvis from 04/20/2019.  TECHNIQUE: Multislice axial images were obtained through the abdomen and pelvis with administration of iodinated intravenous contrast material. Multi-planar reformatted images were generated for additional analysis. Nongated technique limits cardiac detail.  All CT scans at [facility] and [place] are performed using dose optimization techniques as appropriate to a performed exam,  including but not limited to one or more of the following: automated exposure control, adjustment of the mA and/or kV according to patient size, use of iterative reconstruction technique. In addition, [facility] is participating in the [place] program which will further assist [place] in optimizing patient radiation exposure.  FINDINGS:   LOWER CHEST: Refer to contemporaneous chest CT.   ABDOMEN/ PELVIS: .  Liver: Hepatomegaly and hepatic steatosis. .  Gallbladder/biliary: Within normal limits. .  Spleen: Within normal limits. .  Pancreas: Similar hypoattenuation in the pancreatic body which likely reflects involvement by peripancreatic mass/nodal conglomerate. .  Adrenals: Similar 1.5 cm left adrenal nodule.  .  GI tract: Postsurgical changes from partial colectomy. No dilated loops of bowel. .  Peritoneum/ mesentery/extraperitoneum: Similar nonspecific mild mesenteric soft tissue stranding. Similar size of multiple nodal metastases including: -Peripancreatic nodal conglomerate measuring approximately 2.2 x 6.8 cm (6/58), 2.2 x 7.1 cm measured in similar location on prior. -Index aortocaval node measuring approximately 2.2 x 2.3 cm (6/92), 2.2 x 2.3 cm on prior. -Index left periaortic node measuring approximately 2.7 x 2.6 cm,, 2.4 x 3.2 cm on prior (differences in size likely reflect differences in technique) - Index retrocrural node measures approximately 1.9  x 2.3 cm (6/50), 1.9 x 2.3 cm on prior.  .  Kidneys: Mild bilateral perinephric stranding. Symmetric enhancement. No hydronephrosis. .  Ureters: No hydroureter. .  Bladder: Within normal limits.  .  Reproductive system: Within normal limits.  .  Vascular: Aortoiliac atherosclerosis without aneurysmal dilation.  MSK: .  Bilateral hip arthroplasties.  CONCLUSION:  1.  Compared to most recent CT chest abdomen pelvis, similar burden of retroperitoneal lymphadenopathy. 2.  Similar left adrenal metastasis. 3.  Refer to  contemporaneous CT chests for detailed evaluation of findings in the thorax.       e  MRI BRAIN WITH AND WITHOUT CONTRAST, 05/14/2019 7:38 PM  INDICATION: brain mets \ C34.90 Small cell lung cancer ([place])   COMPARISON: Brain MRI dated 03/22/2017  TECHNIQUE: Multiplanar, multi-sequence magnetic resonance imaging of the brain was performed before and after intravenous administration of gadolinium-based contrast.  FINDINGS:  Calvarium/skull base: No focal marrow replacing lesion. Bilateral mastoid effusions without large nasopharyngeal mass noted.  Orbits: No mass lesion.   Paranasal sinuses: No air-fluid levels.  Brain:   Peripherally enhancing hemorrhagic mass within the left cerebellar hemisphere with central necrosis and marginal hypercellularity as evidenced by restricted diffusion, most consistent with a hemorrhagic metastasis in this patient with known primary malignancy. No central restricted diffusion within the lesion to suggest abscess. A second punctate focus of enhancement is noted within the lateral right cerebellar hemisphere suggesting a second metastasis (series 15, image 17).  No significant residual enhancement within several supra- and infratentorial and upper cervical cord metastases noted on the prior MRI dated 03/22/2017, most consistent with treatment response from whole brain radiation.  A few scattered punctate foci of nonenhancing lobar white matter abnormal magnetic susceptibility effect, favored post-treatment changes.  Progressive periventricular and subcortical white matter T2/FLAIR hyperintensity, most consistent with whole brain radiation changes. Mild progressive cerebral volume loss, likely also related to posttreatment changes.  No acute ischemic infarction or hemorrhage has developed.  There is mild partial effacement of the left lateral aspect of the fourth ventricle without midline shift, herniation, or hydrocephalus.  Normal appearance of the major  intracranial arteries and dural venous sinuses.  Additional comments: None.   Other Result Information  Interface, Rad Results In - 05/15/2019 10:39 AM EDT Formatting of this note might be different from the original. MRI BRAIN WITH AND WITHOUT CONTRAST, 05/14/2019 7:38 PM  INDICATION: brain mets \ C34.90 Small cell lung cancer ([place])   COMPARISON: Brain MRI dated 03/22/2017  TECHNIQUE: Multiplanar, multi-sequence magnetic resonance imaging of the brain was performed before and after intravenous administration of gadolinium-based contrast.  FINDINGS:  Calvarium/skull base: No focal marrow replacing lesion.  Bilateral mastoid effusions without large nasopharyngeal mass noted.  Orbits: No mass lesion.   Paranasal sinuses: No air-fluid levels.  Brain:   Peripherally enhancing hemorrhagic mass within the left cerebellar hemisphere with central necrosis and marginal hypercellularity as evidenced by restricted diffusion, most consistent with a hemorrhagic metastasis in this patient with known primary malignancy. No central restricted diffusion within the lesion to suggest abscess. A second punctate focus of enhancement is noted within the lateral right cerebellar hemisphere suggesting a second metastasis (series 15, image 17).  No significant residual enhancement within several supra- and infratentorial and upper cervical cord metastases noted on the prior MRI dated 03/22/2017, most consistent with treatment response from whole brain radiation.  A few scattered punctate foci of nonenhancing lobar white matter abnormal magnetic susceptibility effect, favored post-treatment changes.  Progressive periventricular and subcortical  white matter T2/FLAIR hyperintensity, most consistent with whole brain radiation changes. Mild progressive cerebral volume loss, likely also related to posttreatment changes.  No acute ischemic infarction or hemorrhage has developed.  There is mild partial effacement of the  left lateral aspect of the fourth ventricle without midline shift, herniation, or hydrocephalus.  Normal appearance of the major intracranial arteries and dural venous sinuses.  Additional comments: None.   CONCLUSION:   Hemorrhagic mass within the left cerebellar hemisphere with mild surrounding vasogenic edema, most consistent with a metastasis. A second punctate focus of enhancement within the right cerebellar hemisphere suggests an additional metastasis. Mild partial effacement of the left lateral fourth ventricle without significant herniation or hydrocephalus.  No acute ischemic infarction or hemorrhage has developed.    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  This is likely causing his pain complaints as above.  Patient will be a candidate for short course of palliative radiation therapy directed at this area.  I discussed the general course of treatment side effects and potential toxicities with patient and his wife.  He appears to understand and wishes to proceed with planned course of treatment.  Plan:  Patient is scheduled for CT simulation later today.  Treatments to start April 22.  Anticipate 10 treatments.  Anticipate 25 Gy given the large volume of recurrence in this area as well as  left kidney in close proximity  and previous treatment to the left adrenal gland.  -----------------------------------  Blair Promise, PhD, MD  This document serves as a record of services personally performed by Gery Pray, MD. It was created on his behalf by Clerance Lav, a trained medical scribe. The creation of this record is based on the scribe's personal observations and the provider's statements to them. This document has been checked and approved by the attending provider.

## 2019-06-09 NOTE — Patient Instructions (Signed)
Coronavirus (COVID-19) Are you at risk?  Are you at risk for the Coronavirus (COVID-19)?  To be considered HIGH RISK for Coronavirus (COVID-19), you have to meet the following criteria:  . Traveled to [place], [place], [place], [place] or [place]; or in the [place] to [place], [place], [place], or [place]; and have fever, cough, and shortness of breath within the last 2 weeks of travel OR . Been in close contact with a person diagnosed with COVID-19 within the last 2 weeks and have fever, cough, and shortness of breath . IF YOU DO NOT MEET THESE CRITERIA, YOU ARE CONSIDERED LOW RISK FOR COVID-19.  What to do if you are HIGH RISK for COVID-19?  . If you are having a medical emergency, call 911. . Seek medical care right away. Before you go to a doctor's office, urgent care or emergency department, call ahead and tell them about your recent travel, contact with someone diagnosed with COVID-19, and your symptoms. You should receive instructions from your physician's office regarding next steps of care.  . When you arrive at healthcare provider, tell the healthcare staff immediately you have returned from visiting [place], [place], [place], [place] or [place]; or traveled in the [place] to [place], [place], [place], or [place]; in the last two weeks or you have been in close contact with a person diagnosed with COVID-19 in the last 2 weeks.   . Tell the health care staff about your symptoms: fever, cough and shortness of breath. . After you have been seen by a medical provider, you will be either: o Tested for (COVID-19) and discharged home on quarantine except to seek medical care if symptoms worsen, and asked to  - Stay home and avoid contact with others until you get your results (4-5 days)  - Avoid travel on public transportation if possible (such as bus, train, or airplane) or o Sent to the Emergency Department by EMS for evaluation, COVID-19 testing, and possible  admission depending on your condition and test results.  What to do if you are LOW RISK for COVID-19?  Reduce your risk of any infection by using the same precautions used for avoiding the common cold or flu:  . Wash your hands often with soap and warm water for at least 20 seconds.  If soap and water are not readily available, use an alcohol-based hand sanitizer with at least 60% alcohol.  . If coughing or sneezing, cover your mouth and nose by coughing or sneezing into the elbow areas of your shirt or coat, into a tissue or into your sleeve (not your hands). . Avoid shaking hands with others and consider head nods or verbal greetings only. . Avoid touching your eyes, nose, or mouth with unwashed hands.  . Avoid close contact with people who are sick. . Avoid places or events with large numbers of people in one location, like concerts or sporting events. . Carefully consider travel plans you have or are making. . If you are planning any travel outside or inside the [place], visit the CDC's Travelers' Health webpage for the latest health notices. . If you have some symptoms but not all symptoms, continue to monitor at home and seek medical attention if your symptoms worsen. . If you are having a medical emergency, call 911.   ADDITIONAL HEALTHCARE OPTIONS FOR PATIENTS  [facility] / e-Visit: [email or web address]         [facility]: [phone]  [facility]: [phone]                   [facility]: [phone]   

## 2019-06-09 NOTE — Progress Notes (Signed)
  Radiation Oncology         (336) [phone] ________________________________  Name: Robert Powers MRN: [id number]  Date: 06/09/2019  DOB: 1956/02/10  SIMULATION AND TREATMENT PLANNING NOTE    ICD-10-CM   1. Secondary malignant neoplasm of pancreas ([facility])  C78.89     DIAGNOSIS: Metastatic small cell lung cancer  NARRATIVE:  The patient was brought to the [facility].  Identity was confirmed.  All relevant records and images related to the planned course of therapy were reviewed.  The patient freely provided informed written consent to proceed with treatment after reviewing the details related to the planned course of therapy. The consent form was witnessed and verified by the simulation staff.  Then, the patient was set-up in a stable reproducible  supine position for radiation therapy.  CT images were obtained.  Surface markings were placed.  The CT images were loaded into the planning software.  Then the target and avoidance structures were contoured.  Treatment planning then occurred.  The radiation prescription was entered and confirmed.  Then, I designed and supervised the construction of a total of 4 medically necessary complex treatment devices.  I have requested : Intensity Modulated Radiotherapy (IMRT) is medically necessary for this case for the following reason:  Previous treatment to this area, small bowel and left kidney in close proximity to target area..  I have ordered:dose calc.  PLAN:  The patient will receive 25 Gy in 10 fractions directed to the pancreatic mass and associated retroperitoneal lymphadenopathy.  -----------------------------------  Blair Promise, PhD, MD

## 2019-06-09 NOTE — Progress Notes (Signed)
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 The overall burden of retroperitoneal lymphadenopathy is also increased compared to prior with largest nodes at the left para-aortic region, the previously indexed left periaortic node measures 3.9 x 2.9 cm compared to 3.2 x 2.4 cm previously There is mild associated mass effect on the left renal vein which remains patent.    Pain on a scale of 0-10 is: Pt reports pain in LEFT side, radiating to back rated 10/10.    Ambulatory status? Walker? Wheelchair?: Pt with slow gait, no assistive device.   SAFETY ISSUES: Prior radiation? Radiation treatment dates:  10/03/16-11/14/16  Site/dose:  Right lung/ 60 Gy in 30 fractions   Beams/energy:   3D/ 10X, 15X Radiation treatment dates:   03/28/2017 - 04/16/2017  Site/dose:   Brain / 35 Gy in 14 fractions  Beams/energy: Photon, Isodose Plan/ 6X Radiation treatment dates:   12/24/17-01/03/18  Site/dose:   Left adrenal//50 Gy in 5 fractions of 10 Gy  Beams/energy:   SBRT// 6X-FFF   Pacemaker/ICD? No  Possible current pregnancy? N/A  Is the patient on methotrexate? No  Additional Complaints / other details:  Pt presents today for reconsult with Dr. Sondra Come for Radiation Oncology. Pt is accompanied by wife.   BP 105/73 (BP Location: Left Arm, Patient Position: Sitting, Cuff Size: Large)   Pulse (!) 115   Temp 98.7 F (37.1 C)   Resp 20   Ht 6' (1.829 m)   Wt 205 lb (93 kg)   SpO2 99%   BMI 27.80 kg/m   Wt Readings from Last 3 Encounters:  06/09/19 205  lb (93 kg)  12/17/17 212 lb 12.8 oz (96.5 kg)  11/21/17 206 lb 6 oz (93.6 kg)   Loma Sousa, RN BSN

## 2019-06-10 DIAGNOSIS — C7972 Secondary malignant neoplasm of left adrenal gland: Secondary | ICD-10-CM | POA: Diagnosis not present

## 2019-06-10 DIAGNOSIS — C7931 Secondary malignant neoplasm of brain: Secondary | ICD-10-CM | POA: Diagnosis not present

## 2019-06-10 DIAGNOSIS — Z510 Encounter for antineoplastic radiation therapy: Secondary | ICD-10-CM | POA: Diagnosis not present

## 2019-06-10 DIAGNOSIS — C7889 Secondary malignant neoplasm of other digestive organs: Secondary | ICD-10-CM | POA: Diagnosis not present

## 2019-06-10 DIAGNOSIS — C3411 Malignant neoplasm of upper lobe, right bronchus or lung: Secondary | ICD-10-CM | POA: Diagnosis not present

## 2019-06-10 NOTE — Progress Notes (Signed)
  Radiation Oncology         (336) [phone] ________________________________  Name: CLELAND SIMKINS MRN: [id number]  Date: 06/11/2019  DOB: Jul 17, 1955  Simulation Verification Note    ICD-10-CM   1. 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 The clinical setup was verified. Then port films were obtained and uploaded to the radiation oncology medical record software. The treatment beams were carefully compared against the planned radiation fields. The position location and shape of the radiation fields was reviewed. They targeted volume of tissue appears to be appropriately covered by the radiation beams. Organs at risk appear to be excluded as planned.  Based on my personal review, I approved the simulation verification. The patient's treatment will proceed as planned.   -----------------------------------  Blair Promise, PhD, MD  This document serves as a record of services personally performed by Gery Pray, MD. It was created on his behalf by Clerance Lav, a trained medical scribe. The creation of this record is based on the scribe's personal observations and the provider's statements to them. This document has been checked and approved by the attending provider.

## 2019-06-11 ENCOUNTER — Ambulatory Visit
Admission: RE | Admit: 2019-06-11 | Discharge: 2019-06-11 | Disposition: A | Discharge status: Home / Self Care | Payer: BC Managed Care – PPO | Source: Ambulatory Visit | Attending: Radiation Oncology | Admitting: Radiation Oncology

## 2019-06-11 DIAGNOSIS — C3411 Malignant neoplasm of upper lobe, right bronchus or lung: Secondary | ICD-10-CM | POA: Diagnosis not present

## 2019-06-11 DIAGNOSIS — C7972 Secondary malignant neoplasm of left adrenal gland: Secondary | ICD-10-CM | POA: Diagnosis not present

## 2019-06-11 DIAGNOSIS — Z510 Encounter for antineoplastic radiation therapy: Secondary | ICD-10-CM | POA: Diagnosis not present

## 2019-06-11 DIAGNOSIS — C7889 Secondary malignant neoplasm of other digestive organs: Secondary | ICD-10-CM | POA: Diagnosis not present

## 2019-06-11 DIAGNOSIS — C7931 Secondary malignant neoplasm of brain: Secondary | ICD-10-CM | POA: Diagnosis not present

## 2019-06-12 ENCOUNTER — Ambulatory Visit
Admission: RE | Admit: 2019-06-12 | Discharge: 2019-06-12 | Disposition: A | Discharge status: Home / Self Care | Payer: BC Managed Care – PPO | Source: Ambulatory Visit | Attending: Radiation Oncology | Admitting: Radiation Oncology

## 2019-06-12 ENCOUNTER — Other Ambulatory Visit: Payer: Self-pay

## 2019-06-12 DIAGNOSIS — C7972 Secondary malignant neoplasm of left adrenal gland: Secondary | ICD-10-CM | POA: Diagnosis not present

## 2019-06-12 DIAGNOSIS — C7889 Secondary malignant neoplasm of other digestive organs: Secondary | ICD-10-CM | POA: Diagnosis not present

## 2019-06-12 DIAGNOSIS — Z510 Encounter for antineoplastic radiation therapy: Secondary | ICD-10-CM | POA: Diagnosis not present

## 2019-06-12 DIAGNOSIS — C7931 Secondary malignant neoplasm of brain: Secondary | ICD-10-CM | POA: Diagnosis not present

## 2019-06-12 DIAGNOSIS — C3411 Malignant neoplasm of upper lobe, right bronchus or lung: Secondary | ICD-10-CM | POA: Diagnosis not present

## 2019-06-15 ENCOUNTER — Other Ambulatory Visit: Payer: Self-pay

## 2019-06-15 ENCOUNTER — Ambulatory Visit
Admission: RE | Admit: 2019-06-15 | Discharge: 2019-06-15 | Disposition: A | Discharge status: Home / Self Care | Payer: BC Managed Care – PPO | Source: Ambulatory Visit | Attending: Radiation Oncology | Admitting: Radiation Oncology

## 2019-06-15 DIAGNOSIS — C3411 Malignant neoplasm of upper lobe, right bronchus or lung: Secondary | ICD-10-CM | POA: Diagnosis not present

## 2019-06-15 DIAGNOSIS — C7931 Secondary malignant neoplasm of brain: Secondary | ICD-10-CM | POA: Diagnosis not present

## 2019-06-15 DIAGNOSIS — C7972 Secondary malignant neoplasm of left adrenal gland: Secondary | ICD-10-CM | POA: Diagnosis not present

## 2019-06-15 DIAGNOSIS — C7889 Secondary malignant neoplasm of other digestive organs: Secondary | ICD-10-CM | POA: Diagnosis not present

## 2019-06-15 DIAGNOSIS — Z510 Encounter for antineoplastic radiation therapy: Secondary | ICD-10-CM | POA: Diagnosis not present

## 2019-06-16 ENCOUNTER — Ambulatory Visit
Admission: RE | Admit: 2019-06-16 | Discharge: 2019-06-16 | Disposition: A | Discharge status: Home / Self Care | Payer: BC Managed Care – PPO | Source: Ambulatory Visit | Attending: Radiation Oncology | Admitting: Radiation Oncology

## 2019-06-16 ENCOUNTER — Other Ambulatory Visit: Payer: Self-pay

## 2019-06-16 ENCOUNTER — Other Ambulatory Visit: Payer: Self-pay | Admitting: Radiation Oncology

## 2019-06-16 ENCOUNTER — Inpatient Hospital Stay: Payer: BC Managed Care – PPO | Attending: Radiation Oncology

## 2019-06-16 DIAGNOSIS — Z95828 Presence of other vascular implants and grafts: Secondary | ICD-10-CM

## 2019-06-16 DIAGNOSIS — C7889 Secondary malignant neoplasm of other digestive organs: Secondary | ICD-10-CM | POA: Diagnosis not present

## 2019-06-16 DIAGNOSIS — C7931 Secondary malignant neoplasm of brain: Secondary | ICD-10-CM | POA: Insufficient documentation

## 2019-06-16 DIAGNOSIS — C3411 Malignant neoplasm of upper lobe, right bronchus or lung: Secondary | ICD-10-CM

## 2019-06-16 DIAGNOSIS — C7972 Secondary malignant neoplasm of left adrenal gland: Secondary | ICD-10-CM | POA: Diagnosis not present

## 2019-06-16 DIAGNOSIS — Z510 Encounter for antineoplastic radiation therapy: Secondary | ICD-10-CM | POA: Diagnosis not present

## 2019-06-16 LAB — CBC WITH DIFFERENTIAL (CANCER CENTER ONLY)
Abs Immature Granulocytes: 0.36 10*3/uL — ABNORMAL HIGH (ref 0.00–0.07)
Basophils Absolute: 0 10*3/uL (ref 0.0–0.1)
Basophils Relative: 0 %
Eosinophils Absolute: 0 10*3/uL (ref 0.0–0.5)
Eosinophils Relative: 0 %
HCT: 33 % — ABNORMAL LOW (ref 39.0–52.0)
Hemoglobin: 10.8 g/dL — ABNORMAL LOW (ref 13.0–17.0)
Immature Granulocytes: 4 %
Lymphocytes Relative: 4 %
Lymphs Abs: 0.4 10*3/uL — ABNORMAL LOW (ref 0.7–4.0)
MCH: 35.3 pg — ABNORMAL HIGH (ref 26.0–34.0)
MCHC: 32.7 g/dL (ref 30.0–36.0)
MCV: 107.8 fL — ABNORMAL HIGH (ref 80.0–100.0)
Monocytes Absolute: 0.9 10*3/uL (ref 0.1–1.0)
Monocytes Relative: 10 %
Neutro Abs: 7.3 10*3/uL (ref 1.7–7.7)
Neutrophils Relative %: 82 %
Platelet Count: 186 10*3/uL (ref 150–400)
RBC: 3.06 MIL/uL — ABNORMAL LOW (ref 4.22–5.81)
RDW: 16.6 % — ABNORMAL HIGH (ref 11.5–15.5)
WBC Count: 9 10*3/uL (ref 4.0–10.5)
nRBC: 0.6 % — ABNORMAL HIGH (ref 0.0–0.2)

## 2019-06-16 LAB — CMP (CANCER CENTER ONLY)
ALT: 39 U/L (ref 0–44)
AST: 33 U/L (ref 15–41)
Albumin: 3.6 g/dL (ref 3.5–5.0)
Alkaline Phosphatase: 43 U/L (ref 38–126)
Anion gap: 12 (ref 5–15)
BUN: 14 mg/dL (ref 8–23)
CO2: 26 mmol/L (ref 22–32)
Calcium: 9 mg/dL (ref 8.9–10.3)
Chloride: 97 mmol/L — ABNORMAL LOW (ref 98–111)
Creatinine: 0.76 mg/dL (ref 0.61–1.24)
GFR, Est AFR Am: >60 mL/min (ref >60)
GFR, Estimated: >60 mL/min (ref >60)
Glucose, Bld: 106 mg/dL — ABNORMAL HIGH (ref 70–99)
Potassium: 3.4 mmol/L — ABNORMAL LOW (ref 3.5–5.1)
Sodium: 135 mmol/L (ref 135–145)
Total Bilirubin: 0.8 mg/dL (ref 0.3–1.2)
Total Protein: 6.5 g/dL (ref 6.5–8.1)

## 2019-06-16 LAB — MAGNESIUM: Magnesium: 1.9 mg/dL (ref 1.7–2.4)

## 2019-06-16 MED ORDER — SODIUM CHLORIDE 0.9% FLUSH
10.0000 mL | INTRAVENOUS | Status: DC | PRN
  Administered 2019-06-16: 16:00:00 10 mL via INTRAVENOUS
  Filled 2019-06-16: qty 10

## 2019-06-16 MED ORDER — HEPARIN SOD (PORK) LOCK FLUSH 100 UNIT/ML IV SOLN
500.0000 [IU] | Freq: Once | INTRAVENOUS | Status: AC
  Administered 2019-06-16: 500 [IU] via INTRAVENOUS
  Filled 2019-06-16: qty 5

## 2019-06-17 ENCOUNTER — Ambulatory Visit
Admission: RE | Admit: 2019-06-17 | Discharge: 2019-06-17 | Disposition: A | Discharge status: Home / Self Care | Payer: BC Managed Care – PPO | Source: Ambulatory Visit | Attending: Radiation Oncology | Admitting: Radiation Oncology

## 2019-06-17 ENCOUNTER — Other Ambulatory Visit: Payer: Self-pay

## 2019-06-17 DIAGNOSIS — Z510 Encounter for antineoplastic radiation therapy: Secondary | ICD-10-CM | POA: Diagnosis not present

## 2019-06-17 DIAGNOSIS — C7972 Secondary malignant neoplasm of left adrenal gland: Secondary | ICD-10-CM | POA: Diagnosis not present

## 2019-06-17 DIAGNOSIS — C3411 Malignant neoplasm of upper lobe, right bronchus or lung: Secondary | ICD-10-CM | POA: Diagnosis not present

## 2019-06-17 DIAGNOSIS — C7931 Secondary malignant neoplasm of brain: Secondary | ICD-10-CM | POA: Diagnosis not present

## 2019-06-17 DIAGNOSIS — C7889 Secondary malignant neoplasm of other digestive organs: Secondary | ICD-10-CM | POA: Diagnosis not present

## 2019-06-18 ENCOUNTER — Ambulatory Visit: Payer: BC Managed Care – PPO

## 2019-06-18 DIAGNOSIS — Z510 Encounter for antineoplastic radiation therapy: Secondary | ICD-10-CM | POA: Diagnosis not present

## 2019-06-18 DIAGNOSIS — C7931 Secondary malignant neoplasm of brain: Secondary | ICD-10-CM | POA: Diagnosis not present

## 2019-06-18 DIAGNOSIS — C3490 Malignant neoplasm of unspecified part of unspecified bronchus or lung: Secondary | ICD-10-CM | POA: Diagnosis not present

## 2019-06-19 ENCOUNTER — Ambulatory Visit
Admission: RE | Admit: 2019-06-19 | Discharge: 2019-06-19 | Disposition: A | Discharge status: Home / Self Care | Payer: BC Managed Care – PPO | Source: Ambulatory Visit | Attending: Radiation Oncology | Admitting: Radiation Oncology

## 2019-06-19 ENCOUNTER — Other Ambulatory Visit: Payer: Self-pay

## 2019-06-19 DIAGNOSIS — Z510 Encounter for antineoplastic radiation therapy: Secondary | ICD-10-CM | POA: Diagnosis not present

## 2019-06-19 DIAGNOSIS — C7972 Secondary malignant neoplasm of left adrenal gland: Secondary | ICD-10-CM | POA: Diagnosis not present

## 2019-06-19 DIAGNOSIS — C3411 Malignant neoplasm of upper lobe, right bronchus or lung: Secondary | ICD-10-CM | POA: Diagnosis not present

## 2019-06-19 DIAGNOSIS — C7931 Secondary malignant neoplasm of brain: Secondary | ICD-10-CM | POA: Diagnosis not present

## 2019-06-19 DIAGNOSIS — C7889 Secondary malignant neoplasm of other digestive organs: Secondary | ICD-10-CM | POA: Diagnosis not present

## 2019-06-22 ENCOUNTER — Ambulatory Visit
Admission: RE | Admit: 2019-06-22 | Discharge: 2019-06-22 | Disposition: A | Discharge status: Home / Self Care | Payer: BC Managed Care – PPO | Source: Ambulatory Visit | Attending: Radiation Oncology | Admitting: Radiation Oncology

## 2019-06-22 ENCOUNTER — Other Ambulatory Visit: Payer: Self-pay

## 2019-06-22 ENCOUNTER — Ambulatory Visit: Payer: BC Managed Care – PPO | Admitting: Radiation Oncology

## 2019-06-22 VITALS — BP 108/72 | HR 102

## 2019-06-22 DIAGNOSIS — C7972 Secondary malignant neoplasm of left adrenal gland: Secondary | ICD-10-CM | POA: Insufficient documentation

## 2019-06-22 DIAGNOSIS — C7889 Secondary malignant neoplasm of other digestive organs: Secondary | ICD-10-CM | POA: Diagnosis not present

## 2019-06-22 DIAGNOSIS — C7931 Secondary malignant neoplasm of brain: Secondary | ICD-10-CM | POA: Insufficient documentation

## 2019-06-22 DIAGNOSIS — C3411 Malignant neoplasm of upper lobe, right bronchus or lung: Secondary | ICD-10-CM | POA: Diagnosis not present

## 2019-06-22 DIAGNOSIS — Z510 Encounter for antineoplastic radiation therapy: Secondary | ICD-10-CM | POA: Insufficient documentation

## 2019-06-23 ENCOUNTER — Ambulatory Visit
Admission: RE | Admit: 2019-06-23 | Discharge: 2019-06-23 | Disposition: A | Discharge status: Home / Self Care | Payer: BC Managed Care – PPO | Source: Ambulatory Visit | Attending: Radiation Oncology | Admitting: Radiation Oncology

## 2019-06-23 ENCOUNTER — Ambulatory Visit: Payer: BC Managed Care – PPO | Admitting: Radiation Oncology

## 2019-06-23 ENCOUNTER — Other Ambulatory Visit: Payer: Self-pay

## 2019-06-23 DIAGNOSIS — C3411 Malignant neoplasm of upper lobe, right bronchus or lung: Secondary | ICD-10-CM | POA: Diagnosis not present

## 2019-06-23 DIAGNOSIS — C7931 Secondary malignant neoplasm of brain: Secondary | ICD-10-CM | POA: Diagnosis not present

## 2019-06-23 DIAGNOSIS — C7972 Secondary malignant neoplasm of left adrenal gland: Secondary | ICD-10-CM | POA: Diagnosis not present

## 2019-06-23 DIAGNOSIS — Z510 Encounter for antineoplastic radiation therapy: Secondary | ICD-10-CM | POA: Diagnosis not present

## 2019-06-23 DIAGNOSIS — C7889 Secondary malignant neoplasm of other digestive organs: Secondary | ICD-10-CM | POA: Diagnosis not present

## 2019-06-23 NOTE — Progress Notes (Signed)
Nurse tech, Carlyon Shadow, reported that this patient's wife wanted his BP checked. Carlyon Shadow explains she checked his BP and the wife was satisfied thus they left the clinic. BP and heart rate documented.

## 2019-06-24 ENCOUNTER — Ambulatory Visit
Admission: RE | Admit: 2019-06-24 | Discharge: 2019-06-24 | Disposition: A | Discharge status: Home / Self Care | Payer: BC Managed Care – PPO | Source: Ambulatory Visit | Attending: Radiation Oncology | Admitting: Radiation Oncology

## 2019-06-24 ENCOUNTER — Ambulatory Visit: Payer: BC Managed Care – PPO | Admitting: Radiation Oncology

## 2019-06-24 ENCOUNTER — Other Ambulatory Visit: Payer: Self-pay

## 2019-06-24 DIAGNOSIS — C7931 Secondary malignant neoplasm of brain: Secondary | ICD-10-CM | POA: Diagnosis not present

## 2019-06-24 DIAGNOSIS — C7972 Secondary malignant neoplasm of left adrenal gland: Secondary | ICD-10-CM | POA: Diagnosis not present

## 2019-06-24 DIAGNOSIS — C7889 Secondary malignant neoplasm of other digestive organs: Secondary | ICD-10-CM | POA: Diagnosis not present

## 2019-06-24 DIAGNOSIS — C3411 Malignant neoplasm of upper lobe, right bronchus or lung: Secondary | ICD-10-CM | POA: Diagnosis not present

## 2019-06-24 DIAGNOSIS — Z510 Encounter for antineoplastic radiation therapy: Secondary | ICD-10-CM | POA: Diagnosis not present

## 2019-06-25 ENCOUNTER — Other Ambulatory Visit: Payer: Self-pay

## 2019-06-25 ENCOUNTER — Encounter: Payer: Self-pay | Admitting: Radiation Oncology

## 2019-06-25 ENCOUNTER — Ambulatory Visit: Payer: BC Managed Care – PPO | Admitting: Radiation Oncology

## 2019-06-25 ENCOUNTER — Ambulatory Visit
Admission: RE | Admit: 2019-06-25 | Discharge: 2019-06-25 | Disposition: A | Discharge status: Home / Self Care | Payer: BC Managed Care – PPO | Source: Ambulatory Visit | Attending: Radiation Oncology | Admitting: Radiation Oncology

## 2019-06-25 DIAGNOSIS — C3411 Malignant neoplasm of upper lobe, right bronchus or lung: Secondary | ICD-10-CM | POA: Diagnosis not present

## 2019-06-25 DIAGNOSIS — C7931 Secondary malignant neoplasm of brain: Secondary | ICD-10-CM | POA: Diagnosis not present

## 2019-06-25 DIAGNOSIS — C7889 Secondary malignant neoplasm of other digestive organs: Secondary | ICD-10-CM | POA: Diagnosis not present

## 2019-06-25 DIAGNOSIS — C7972 Secondary malignant neoplasm of left adrenal gland: Secondary | ICD-10-CM | POA: Diagnosis not present

## 2019-06-25 DIAGNOSIS — Z510 Encounter for antineoplastic radiation therapy: Secondary | ICD-10-CM | POA: Diagnosis not present

## 2019-06-26 ENCOUNTER — Ambulatory Visit: Payer: BC Managed Care – PPO | Admitting: Radiation Oncology

## 2019-06-29 ENCOUNTER — Ambulatory Visit: Payer: BC Managed Care – PPO | Admitting: Radiation Oncology

## 2019-06-30 ENCOUNTER — Ambulatory Visit: Payer: BC Managed Care – PPO | Admitting: Radiation Oncology

## 2019-07-01 ENCOUNTER — Ambulatory Visit: Payer: BC Managed Care – PPO | Admitting: Radiation Oncology

## 2019-07-02 ENCOUNTER — Ambulatory Visit: Payer: BC Managed Care – PPO | Admitting: Radiation Oncology

## 2019-07-03 ENCOUNTER — Ambulatory Visit: Payer: BC Managed Care – PPO | Admitting: Radiation Oncology

## 2019-07-03 DIAGNOSIS — C772 Secondary and unspecified malignant neoplasm of intra-abdominal lymph nodes: Secondary | ICD-10-CM | POA: Diagnosis not present

## 2019-07-03 DIAGNOSIS — M545 Low back pain: Secondary | ICD-10-CM | POA: Diagnosis not present

## 2019-07-03 DIAGNOSIS — S301XXA Contusion of abdominal wall, initial encounter: Secondary | ICD-10-CM | POA: Diagnosis not present

## 2019-07-03 DIAGNOSIS — M546 Pain in thoracic spine: Secondary | ICD-10-CM | POA: Diagnosis not present

## 2019-07-03 DIAGNOSIS — C7931 Secondary malignant neoplasm of brain: Secondary | ICD-10-CM | POA: Diagnosis not present

## 2019-07-03 DIAGNOSIS — R296 Repeated falls: Secondary | ICD-10-CM | POA: Diagnosis not present

## 2019-07-03 DIAGNOSIS — Z8521 Personal history of malignant neoplasm of larynx: Secondary | ICD-10-CM | POA: Diagnosis not present

## 2019-07-03 DIAGNOSIS — R410 Disorientation, unspecified: Secondary | ICD-10-CM | POA: Diagnosis not present

## 2019-07-03 DIAGNOSIS — R102 Pelvic and perineal pain: Secondary | ICD-10-CM | POA: Diagnosis not present

## 2019-07-03 DIAGNOSIS — R188 Other ascites: Secondary | ICD-10-CM | POA: Diagnosis not present

## 2019-07-03 DIAGNOSIS — S199XXA Unspecified injury of neck, initial encounter: Secondary | ICD-10-CM | POA: Diagnosis not present

## 2019-07-03 DIAGNOSIS — W19XXXA Unspecified fall, initial encounter: Secondary | ICD-10-CM | POA: Diagnosis not present

## 2019-07-03 DIAGNOSIS — E785 Hyperlipidemia, unspecified: Secondary | ICD-10-CM | POA: Diagnosis not present

## 2019-07-03 DIAGNOSIS — R4182 Altered mental status, unspecified: Secondary | ICD-10-CM | POA: Diagnosis not present

## 2019-07-03 DIAGNOSIS — S0990XA Unspecified injury of head, initial encounter: Secondary | ICD-10-CM | POA: Diagnosis not present

## 2019-07-03 DIAGNOSIS — C3490 Malignant neoplasm of unspecified part of unspecified bronchus or lung: Secondary | ICD-10-CM | POA: Diagnosis not present

## 2019-07-03 DIAGNOSIS — R079 Chest pain, unspecified: Secondary | ICD-10-CM | POA: Diagnosis not present

## 2019-07-03 DIAGNOSIS — R000 Tachycardia, unspecified: Secondary | ICD-10-CM | POA: Diagnosis not present

## 2019-07-03 DIAGNOSIS — I499 Cardiac arrhythmia, unspecified: Secondary | ICD-10-CM | POA: Diagnosis not present

## 2019-07-03 DIAGNOSIS — I614 Nontraumatic intracerebral hemorrhage in cerebellum: Secondary | ICD-10-CM | POA: Diagnosis not present

## 2019-07-03 DIAGNOSIS — Z87891 Personal history of nicotine dependence: Secondary | ICD-10-CM | POA: Diagnosis not present

## 2019-07-03 DIAGNOSIS — Z7982 Long term (current) use of aspirin: Secondary | ICD-10-CM | POA: Diagnosis not present

## 2019-07-03 DIAGNOSIS — C3491 Malignant neoplasm of unspecified part of right bronchus or lung: Secondary | ICD-10-CM | POA: Diagnosis not present

## 2019-07-03 DIAGNOSIS — R918 Other nonspecific abnormal finding of lung field: Secondary | ICD-10-CM | POA: Diagnosis not present

## 2019-07-03 DIAGNOSIS — G936 Cerebral edema: Secondary | ICD-10-CM | POA: Diagnosis not present

## 2019-07-03 DIAGNOSIS — I10 Essential (primary) hypertension: Secondary | ICD-10-CM | POA: Diagnosis not present

## 2019-07-03 DIAGNOSIS — W010XXA Fall on same level from slipping, tripping and stumbling without subsequent striking against object, initial encounter: Secondary | ICD-10-CM | POA: Diagnosis not present

## 2019-07-03 DIAGNOSIS — G893 Neoplasm related pain (acute) (chronic): Secondary | ICD-10-CM | POA: Diagnosis not present

## 2019-07-03 DIAGNOSIS — Z96643 Presence of artificial hip joint, bilateral: Secondary | ICD-10-CM | POA: Diagnosis not present

## 2019-07-03 DIAGNOSIS — R339 Retention of urine, unspecified: Secondary | ICD-10-CM | POA: Diagnosis not present

## 2019-07-03 DIAGNOSIS — R39198 Other difficulties with micturition: Secondary | ICD-10-CM | POA: Diagnosis not present

## 2019-07-04 DIAGNOSIS — C3491 Malignant neoplasm of unspecified part of right bronchus or lung: Secondary | ICD-10-CM | POA: Diagnosis not present

## 2019-07-04 DIAGNOSIS — W19XXXA Unspecified fall, initial encounter: Secondary | ICD-10-CM | POA: Diagnosis not present

## 2019-07-04 DIAGNOSIS — R339 Retention of urine, unspecified: Secondary | ICD-10-CM | POA: Diagnosis not present

## 2019-07-04 DIAGNOSIS — I614 Nontraumatic intracerebral hemorrhage in cerebellum: Secondary | ICD-10-CM | POA: Diagnosis not present

## 2019-07-04 DIAGNOSIS — R4182 Altered mental status, unspecified: Secondary | ICD-10-CM | POA: Diagnosis not present

## 2019-07-04 DIAGNOSIS — C7931 Secondary malignant neoplasm of brain: Secondary | ICD-10-CM | POA: Diagnosis not present

## 2019-07-05 DIAGNOSIS — C7931 Secondary malignant neoplasm of brain: Secondary | ICD-10-CM | POA: Diagnosis not present

## 2019-07-05 DIAGNOSIS — R339 Retention of urine, unspecified: Secondary | ICD-10-CM | POA: Diagnosis not present

## 2019-07-05 DIAGNOSIS — C3491 Malignant neoplasm of unspecified part of right bronchus or lung: Secondary | ICD-10-CM | POA: Diagnosis not present

## 2019-07-05 DIAGNOSIS — R4182 Altered mental status, unspecified: Secondary | ICD-10-CM | POA: Diagnosis not present

## 2019-07-06 DIAGNOSIS — R4182 Altered mental status, unspecified: Secondary | ICD-10-CM | POA: Diagnosis not present

## 2019-07-06 DIAGNOSIS — C7931 Secondary malignant neoplasm of brain: Secondary | ICD-10-CM | POA: Diagnosis not present

## 2019-07-06 DIAGNOSIS — G893 Neoplasm related pain (acute) (chronic): Secondary | ICD-10-CM | POA: Diagnosis not present

## 2019-07-06 DIAGNOSIS — W19XXXA Unspecified fall, initial encounter: Secondary | ICD-10-CM | POA: Diagnosis not present

## 2019-07-06 DIAGNOSIS — I499 Cardiac arrhythmia, unspecified: Secondary | ICD-10-CM | POA: Diagnosis not present

## 2019-07-06 DIAGNOSIS — C3490 Malignant neoplasm of unspecified part of unspecified bronchus or lung: Secondary | ICD-10-CM | POA: Diagnosis not present

## 2019-07-07 DIAGNOSIS — C3490 Malignant neoplasm of unspecified part of unspecified bronchus or lung: Secondary | ICD-10-CM | POA: Diagnosis not present

## 2019-07-07 DIAGNOSIS — C7931 Secondary malignant neoplasm of brain: Secondary | ICD-10-CM | POA: Diagnosis not present

## 2019-07-07 DIAGNOSIS — C3491 Malignant neoplasm of unspecified part of right bronchus or lung: Secondary | ICD-10-CM | POA: Diagnosis not present

## 2019-07-07 DIAGNOSIS — R339 Retention of urine, unspecified: Secondary | ICD-10-CM | POA: Diagnosis not present

## 2019-07-07 DIAGNOSIS — Z79899 Other long term (current) drug therapy: Secondary | ICD-10-CM | POA: Diagnosis not present

## 2019-07-13 DIAGNOSIS — C3491 Malignant neoplasm of unspecified part of right bronchus or lung: Secondary | ICD-10-CM | POA: Diagnosis not present

## 2019-07-13 DIAGNOSIS — C7931 Secondary malignant neoplasm of brain: Secondary | ICD-10-CM | POA: Diagnosis not present

## 2019-07-13 DIAGNOSIS — C3490 Malignant neoplasm of unspecified part of unspecified bronchus or lung: Secondary | ICD-10-CM | POA: Diagnosis not present

## 2019-07-13 DIAGNOSIS — Z9221 Personal history of antineoplastic chemotherapy: Secondary | ICD-10-CM | POA: Diagnosis not present

## 2019-07-13 DIAGNOSIS — Z79899 Other long term (current) drug therapy: Secondary | ICD-10-CM | POA: Diagnosis not present

## 2019-07-15 DIAGNOSIS — R600 Localized edema: Secondary | ICD-10-CM | POA: Diagnosis not present

## 2019-07-15 DIAGNOSIS — C3490 Malignant neoplasm of unspecified part of unspecified bronchus or lung: Secondary | ICD-10-CM | POA: Diagnosis not present

## 2019-07-21 DIAGNOSIS — I959 Hypotension, unspecified: Secondary | ICD-10-CM | POA: Diagnosis not present

## 2019-07-21 DIAGNOSIS — R339 Retention of urine, unspecified: Secondary | ICD-10-CM | POA: Diagnosis not present

## 2019-07-21 DIAGNOSIS — R600 Localized edema: Secondary | ICD-10-CM | POA: Diagnosis not present

## 2019-07-21 DIAGNOSIS — R5383 Other fatigue: Secondary | ICD-10-CM | POA: Diagnosis not present

## 2019-07-21 DIAGNOSIS — C3481 Malignant neoplasm of overlapping sites of right bronchus and lung: Secondary | ICD-10-CM | POA: Diagnosis not present

## 2019-07-21 DIAGNOSIS — C3490 Malignant neoplasm of unspecified part of unspecified bronchus or lung: Secondary | ICD-10-CM | POA: Diagnosis not present

## 2019-07-21 DIAGNOSIS — R519 Headache, unspecified: Secondary | ICD-10-CM | POA: Diagnosis not present

## 2019-07-21 DIAGNOSIS — C7931 Secondary malignant neoplasm of brain: Secondary | ICD-10-CM | POA: Diagnosis not present

## 2019-07-21 DIAGNOSIS — Z8521 Personal history of malignant neoplasm of larynx: Secondary | ICD-10-CM | POA: Diagnosis not present

## 2019-07-21 DIAGNOSIS — R2243 Localized swelling, mass and lump, lower limb, bilateral: Secondary | ICD-10-CM | POA: Diagnosis not present

## 2019-07-21 DIAGNOSIS — M549 Dorsalgia, unspecified: Secondary | ICD-10-CM | POA: Diagnosis not present

## 2019-07-22 DIAGNOSIS — R0981 Nasal congestion: Secondary | ICD-10-CM | POA: Diagnosis not present

## 2019-07-22 DIAGNOSIS — R339 Retention of urine, unspecified: Secondary | ICD-10-CM | POA: Diagnosis not present

## 2019-07-22 DIAGNOSIS — R600 Localized edema: Secondary | ICD-10-CM | POA: Diagnosis not present

## 2019-07-22 DIAGNOSIS — C3490 Malignant neoplasm of unspecified part of unspecified bronchus or lung: Secondary | ICD-10-CM | POA: Diagnosis not present

## 2019-07-22 DIAGNOSIS — C7931 Secondary malignant neoplasm of brain: Secondary | ICD-10-CM | POA: Diagnosis not present

## 2019-07-22 DIAGNOSIS — C3481 Malignant neoplasm of overlapping sites of right bronchus and lung: Secondary | ICD-10-CM | POA: Diagnosis not present

## 2019-07-22 NOTE — Progress Notes (Incomplete)
  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 He did report some left-sided back pain, severe fatigue, and weakness. He denied nausea. His wife reported some confusion and ongoing constipation following radiation. Overall, the patient did not report any improvement in his pain.  On 06/16/2019, the patient was noted to be hypotensive and his wife reported some jerking sensations. In light of that, the patient had a CBC with differential metabolic panel and magnesium done. The patient's magnesium was noted to be within normal limits but his hemoglobin was 10.8.   Plan: The patient will follow-up with radiation oncology in one month.  ________________________________________________   Blair Promise, PhD, MD  This document serves as a record of services personally performed by Gery Pray, MD. It was created on his behalf by Clerance Lav, a trained medical scribe. The creation of this record is based on the scribe's personal  observations and the provider's statements to them. This document has been checked and approved by the attending provider.

## 2019-07-27 DIAGNOSIS — R0609 Other forms of dyspnea: Secondary | ICD-10-CM | POA: Diagnosis not present

## 2019-07-27 DIAGNOSIS — R2689 Other abnormalities of gait and mobility: Secondary | ICD-10-CM | POA: Diagnosis not present

## 2019-07-27 DIAGNOSIS — R609 Edema, unspecified: Secondary | ICD-10-CM | POA: Diagnosis not present

## 2019-07-27 DIAGNOSIS — C3490 Malignant neoplasm of unspecified part of unspecified bronchus or lung: Secondary | ICD-10-CM | POA: Diagnosis not present

## 2019-07-29 DIAGNOSIS — Z9889 Other specified postprocedural states: Secondary | ICD-10-CM | POA: Diagnosis not present

## 2019-07-29 DIAGNOSIS — C3490 Malignant neoplasm of unspecified part of unspecified bronchus or lung: Secondary | ICD-10-CM | POA: Diagnosis not present

## 2019-07-29 DIAGNOSIS — C7931 Secondary malignant neoplasm of brain: Secondary | ICD-10-CM | POA: Diagnosis not present

## 2019-08-03 ENCOUNTER — Ambulatory Visit
Admission: RE | Admit: 2019-08-03 | Discharge: 2019-08-03 | Disposition: A | Discharge status: Home / Self Care | Payer: BC Managed Care – PPO | Source: Ambulatory Visit | Attending: Radiation Oncology | Admitting: Radiation Oncology

## 2019-08-03 ENCOUNTER — Telehealth: Payer: Self-pay

## 2019-08-03 DIAGNOSIS — C7931 Secondary malignant neoplasm of brain: Secondary | ICD-10-CM | POA: Diagnosis not present

## 2019-08-03 DIAGNOSIS — R600 Localized edema: Secondary | ICD-10-CM | POA: Diagnosis not present

## 2019-08-03 DIAGNOSIS — E871 Hypo-osmolality and hyponatremia: Secondary | ICD-10-CM | POA: Diagnosis not present

## 2019-08-03 DIAGNOSIS — R110 Nausea: Secondary | ICD-10-CM | POA: Diagnosis not present

## 2019-08-03 DIAGNOSIS — R590 Localized enlarged lymph nodes: Secondary | ICD-10-CM | POA: Diagnosis not present

## 2019-08-03 DIAGNOSIS — K7689 Other specified diseases of liver: Secondary | ICD-10-CM | POA: Diagnosis not present

## 2019-08-03 DIAGNOSIS — R188 Other ascites: Secondary | ICD-10-CM | POA: Diagnosis not present

## 2019-08-03 DIAGNOSIS — C3490 Malignant neoplasm of unspecified part of unspecified bronchus or lung: Secondary | ICD-10-CM | POA: Diagnosis not present

## 2019-08-03 DIAGNOSIS — C329 Malignant neoplasm of larynx, unspecified: Secondary | ICD-10-CM | POA: Diagnosis not present

## 2019-08-03 DIAGNOSIS — I959 Hypotension, unspecified: Secondary | ICD-10-CM | POA: Diagnosis not present

## 2019-08-03 DIAGNOSIS — C3411 Malignant neoplasm of upper lobe, right bronchus or lung: Secondary | ICD-10-CM | POA: Diagnosis not present

## 2019-08-03 NOTE — Telephone Encounter (Signed)
Returned pt's wife's call. She called to cancel her husband's appt. today and states no f/u needed as Hospice has been called in.

## 2019-08-04 DIAGNOSIS — C3490 Malignant neoplasm of unspecified part of unspecified bronchus or lung: Secondary | ICD-10-CM | POA: Diagnosis not present

## 2019-08-05 DIAGNOSIS — C3490 Malignant neoplasm of unspecified part of unspecified bronchus or lung: Secondary | ICD-10-CM | POA: Diagnosis not present

## 2019-08-06 DIAGNOSIS — C3490 Malignant neoplasm of unspecified part of unspecified bronchus or lung: Secondary | ICD-10-CM | POA: Diagnosis not present

## 2019-08-07 DIAGNOSIS — C3490 Malignant neoplasm of unspecified part of unspecified bronchus or lung: Secondary | ICD-10-CM | POA: Diagnosis not present

## 2019-08-08 DIAGNOSIS — C3490 Malignant neoplasm of unspecified part of unspecified bronchus or lung: Secondary | ICD-10-CM | POA: Diagnosis not present

## 2019-08-09 DIAGNOSIS — C3490 Malignant neoplasm of unspecified part of unspecified bronchus or lung: Secondary | ICD-10-CM | POA: Diagnosis not present

## 2019-08-10 DIAGNOSIS — C3490 Malignant neoplasm of unspecified part of unspecified bronchus or lung: Secondary | ICD-10-CM | POA: Diagnosis not present

## 2019-08-11 DIAGNOSIS — C3490 Malignant neoplasm of unspecified part of unspecified bronchus or lung: Secondary | ICD-10-CM | POA: Diagnosis not present

## 2019-08-12 DIAGNOSIS — C3490 Malignant neoplasm of unspecified part of unspecified bronchus or lung: Secondary | ICD-10-CM | POA: Diagnosis not present

## 2019-08-13 DIAGNOSIS — C3490 Malignant neoplasm of unspecified part of unspecified bronchus or lung: Secondary | ICD-10-CM | POA: Diagnosis not present

## 2019-08-14 DIAGNOSIS — C3490 Malignant neoplasm of unspecified part of unspecified bronchus or lung: Secondary | ICD-10-CM | POA: Diagnosis not present

## 2019-08-15 DIAGNOSIS — C3490 Malignant neoplasm of unspecified part of unspecified bronchus or lung: Secondary | ICD-10-CM | POA: Diagnosis not present

## 2019-08-16 DIAGNOSIS — C3490 Malignant neoplasm of unspecified part of unspecified bronchus or lung: Secondary | ICD-10-CM | POA: Diagnosis not present

## 2019-08-17 DIAGNOSIS — C3490 Malignant neoplasm of unspecified part of unspecified bronchus or lung: Secondary | ICD-10-CM | POA: Diagnosis not present

## 2019-08-18 DIAGNOSIS — C3490 Malignant neoplasm of unspecified part of unspecified bronchus or lung: Secondary | ICD-10-CM | POA: Diagnosis not present

## 2019-08-19 DIAGNOSIS — C3490 Malignant neoplasm of unspecified part of unspecified bronchus or lung: Secondary | ICD-10-CM | POA: Diagnosis not present

## 2019-09-20 DEATH — deceased
